# Patient Record
Sex: Female | Born: 1990
Health system: Southern US, Community
[De-identification: ages and names within clinical notes are randomized; demographics above are authoritative.]

## PROBLEM LIST (undated history)

## (undated) DIAGNOSIS — I10 Essential (primary) hypertension: Secondary | ICD-10-CM

## (undated) DIAGNOSIS — E46 Unspecified protein-calorie malnutrition: Secondary | ICD-10-CM

## (undated) DIAGNOSIS — R0989 Other specified symptoms and signs involving the circulatory and respiratory systems: Secondary | ICD-10-CM

## (undated) DIAGNOSIS — Z599 Problem related to housing and economic circumstances, unspecified: Secondary | ICD-10-CM

## (undated) DIAGNOSIS — B181 Chronic viral hepatitis B without delta-agent: Secondary | ICD-10-CM

## (undated) DIAGNOSIS — B20 Human immunodeficiency virus [HIV] disease: Secondary | ICD-10-CM

## (undated) DIAGNOSIS — Z21 Asymptomatic human immunodeficiency virus [HIV] infection status: Secondary | ICD-10-CM

## (undated) DIAGNOSIS — R1310 Dysphagia, unspecified: Secondary | ICD-10-CM

## (undated) DIAGNOSIS — L0291 Cutaneous abscess, unspecified: Secondary | ICD-10-CM

## (undated) HISTORY — DX: Problem related to housing and economic circumstances, unspecified: Z59.9

## (undated) HISTORY — DX: Asymptomatic human immunodeficiency virus (hiv) infection status: Z21

## (undated) HISTORY — DX: Dysphagia, unspecified: R13.10

## (undated) HISTORY — DX: Cutaneous abscess, unspecified: L02.91

## (undated) HISTORY — DX: Other specified symptoms and signs involving the circulatory and respiratory systems: R09.89

## (undated) HISTORY — DX: Essential (primary) hypertension: I10

## (undated) HISTORY — DX: Chronic viral hepatitis B without delta-agent: B18.1

## (undated) HISTORY — DX: Unspecified protein-calorie malnutrition: E46

## (undated) HISTORY — DX: Human immunodeficiency virus (HIV) disease: B20

---

## 2014-06-28 ENCOUNTER — Encounter (HOSPITAL_COMMUNITY): Payer: Self-pay | Admitting: Emergency Medicine

## 2014-06-28 ENCOUNTER — Emergency Department (INDEPENDENT_AMBULATORY_CARE_PROVIDER_SITE_OTHER)
Admission: EM | Admit: 2014-06-28 | Discharge: 2014-06-28 | Disposition: A | Discharge status: Home / Self Care | Payer: Medicaid Other | Source: Home / Self Care | Attending: Family Medicine | Admitting: Family Medicine

## 2014-06-28 DIAGNOSIS — R002 Palpitations: Secondary | ICD-10-CM | POA: Diagnosis not present

## 2014-06-28 NOTE — ED Provider Notes (Signed)
Marie Olson is a 24 y.o. female who presents to Urgent Care today for palpitations present for 2 days. Patient denies any syncope chest pain or shortness of breath. No vomiting or diarrhea. No medications. Patient is a refugee from [place] arriving 8 days ago.   History reviewed. No pertinent past medical history. History reviewed. No pertinent past surgical history. History  Substance Use Topics  . Smoking status: Never Smoker   . Smokeless tobacco: Not on file  . Alcohol Use: No   ROS as above Medications: No current facility-administered medications for this encounter.   No current outpatient prescriptions on file.   No Known Allergies   Exam:  BP 120/70 mmHg  Pulse 102  Temp(Src) 98.5 F (36.9 C) (Oral)  Resp 16  SpO2 100%  LMP 06/24/2014 Gen: Well NAD nontoxic appearing HEENT: EOMI,  MMM Lungs: Normal work of breathing. CTABL Heart: RRR no MRG rate 85 bpm per my check Abd: NABS, Soft. Nondistended, Nontender Exts: Brisk capillary refill, warm and well perfused. Non-edematous  Twelve-lead EKG shows sinus rhythm at 94 bpm. No ST segment elevation or depression. No Q waves. PR interval is slightly short at 110 ms. QTc 427  No results found for this or any previous visit (from the past 24 hour(s)). No results found.  Assessment and Plan: 24 y.o. female with palpitations. This is likely anxiety. EKG is largely normal with short PR interval at 110 ms. Will refer to Dr. Gwendolyn GrantWalden at immigration clinic for further follow-up. Present to the emergency room if worsening.  A swahili interpreter was used for today's visit.  Discussed warning signs or symptoms. Please see discharge instructions. Patient expresses understanding.     Rodolph BongEvan S Curtez Brallier, MD 06/28/14 [phone]

## 2014-06-28 NOTE — Discharge Instructions (Signed)
Thank you for coming in today. Call or go to the emergency room if you get worse, have trouble breathing, have chest pains, or palpitations.    

## 2014-06-28 NOTE — ED Notes (Signed)
Pt was being screened at a clinic and was told to come and be evaluated due to rapid heart rate.   Denies chest pain and sob.

## 2014-07-06 ENCOUNTER — Encounter: Payer: Self-pay | Admitting: Family Medicine

## 2014-07-06 ENCOUNTER — Ambulatory Visit (INDEPENDENT_AMBULATORY_CARE_PROVIDER_SITE_OTHER): Payer: Medicaid Other | Admitting: Family Medicine

## 2014-07-06 VITALS — BP 128/82 | HR 96 | Temp 98.6°F | Ht 59.5 in | Wt 96.0 lb

## 2014-07-06 DIAGNOSIS — Z603 Acculturation difficulty: Secondary | ICD-10-CM

## 2014-07-06 DIAGNOSIS — R002 Palpitations: Secondary | ICD-10-CM

## 2014-07-06 DIAGNOSIS — Z0289 Encounter for other administrative examinations: Secondary | ICD-10-CM

## 2014-07-06 DIAGNOSIS — Z008 Encounter for other general examination: Secondary | ICD-10-CM

## 2014-07-06 NOTE — Patient Instructions (Signed)
Your heart rate and blood pressure are good today.  Come back to make sure you're okay in 1 month.    It was good to see you.

## 2014-07-06 NOTE — Progress Notes (Signed)
[employer] interpretor used to conduct visit.  Name: Maralyn SagoSarah ID# [id number] Obinna Ehresman, Maryjo RochesterJessica Dawn

## 2014-07-06 NOTE — Progress Notes (Signed)
Patient ID: Marie Olson, female   DOB: 10/25/1990, 24 y.o.   MRN: [id number] Phone interpreter utilized during today's visit - Swahili.  Marie Olson (case Haematologistmanager [facility] Services) also present today for entire visit.   Imm[facility] New Patient Visit  HPI:  [facility] patient appointment to establish general primary care, also to discuss heart palpitations.    Heart palpitations.  Some confusion over what she means by this.  She calls this "pressure."  Was told at overseas exam prior to arriving in [place] she had HTN.  However presented for this at Urgent Care, and seems it was more palpitations.  Describes occaional "heart-racing."  Inconsistent when this occurs.  Denies SOB.  Denies chest pain or syncope/pre-syncope.  Adequate amounts of food/water intake.  Denies any symptoms since being seen [facility]  ROS: See HPI  Immigrant Social History: - Date arrived in [place]: Jun 13 2014 - Country of origin: [place].  Fled in 1996 and has lived in refugee camp in [place] since then. Fled due to ongoing war.  - Primary language: Swahili  -Requires intepreter (essentially speaks no [place]) - Tobacco/alcohol/drug use: denies - Marriage Status: married, lives at home with husband and 3 children. - Were you beaten or tortured in your country or refugee camp?  Denies  LMP was Feb 26 (last week)   Past Medical Hx:  -denies other than high blood pressure at overseas eam  Past Surgical Hx:  -denies   PHYSICAL EXAM: BP 128/82 mmHg  Pulse 96  Temp(Src) 98.6 F (37 C) (Oral)  Ht 4' 11.5" (1.511 m)  Wt 96 lb (43.545 kg)  BMI 19.07 kg/m2  LMP 06/24/2014 Gen: Well NAD HEENT: EOMI,  MMM Lungs: CTABL Nl WOB Heart: RRR no MRG Abd: NABS, NT, ND Exts: Non edematous BL  LE, warm and well perfused. Psych:  Not depressed, anxious appearing.  Smiling and conversant. Neuro:  No focal deficits noted.

## 2014-07-07 DIAGNOSIS — R002 Palpitations: Secondary | ICD-10-CM | POA: Insufficient documentation

## 2014-07-07 DIAGNOSIS — Z603 Acculturation difficulty: Secondary | ICD-10-CM | POA: Insufficient documentation

## 2014-07-07 DIAGNOSIS — Z3169 Encounter for other general counseling and advice on procreation: Secondary | ICD-10-CM | POA: Insufficient documentation

## 2014-07-07 NOTE — Assessment & Plan Note (Signed)
Unclear symptoms.  [facility] had [facility].  No worrying symptoms.  EKG there showed only short PR interval.   FU in 1 month to re-assess (daughter returning then) and after obtaining HD records, can space out more appropriately after that.

## 2014-08-01 ENCOUNTER — Telehealth: Payer: Self-pay

## 2014-08-01 DIAGNOSIS — Z21 Asymptomatic human immunodeficiency virus [HIV] infection status: Secondary | ICD-10-CM | POA: Insufficient documentation

## 2014-08-01 DIAGNOSIS — B20 Human immunodeficiency virus [HIV] disease: Secondary | ICD-10-CM | POA: Insufficient documentation

## 2014-08-01 NOTE — Telephone Encounter (Signed)
Call made to patient via [employer]. Patient speaks Swahili   Appointment given for patient and spouse. Referral received from [facility].  Patient advised to contact his Case Manager with [employer] [facility].   Tammy K King, RN     

## 2014-08-03 ENCOUNTER — Encounter: Payer: Self-pay | Admitting: Family Medicine

## 2014-08-03 ENCOUNTER — Ambulatory Visit (INDEPENDENT_AMBULATORY_CARE_PROVIDER_SITE_OTHER): Payer: Medicaid Other | Admitting: Family Medicine

## 2014-08-03 ENCOUNTER — Ambulatory Visit: Payer: Medicaid Other

## 2014-08-03 VITALS — BP 137/93 | HR 93 | Temp 97.9°F | Ht 60.0 in | Wt 94.7 lb

## 2014-08-03 DIAGNOSIS — R002 Palpitations: Secondary | ICD-10-CM

## 2014-08-03 NOTE — Assessment & Plan Note (Addendum)
History of daily symptoms. Possibly related to anxiety, however cannot rule out primary cardiac etiology. Does not seem like PAC/PVCs from history. Possibly PSVT with decreased PR from previous EKG  Echocardiogram  Cardiology referral for cardiac monitoring device  Will hold off on starting beta blocker

## 2014-08-03 NOTE — Progress Notes (Signed)
Patient was accompanied by Joyce from [employer] because of language barrier.Simpson, Michelle R  

## 2014-08-03 NOTE — Progress Notes (Signed)
    Subjective    Marie Olson is a 24 y.o. female that presents for an office visit.   1. Heart palpitations: Symptoms started about 2 months ago. They occur intermittently every day. She says that it feels like rapid heart beats and not flip-flopping. Episodes last about 3-4 minutes. Symptoms appear to be precipitated by excitement usually, including when there are noises (doorbell ringing, traffic noises, etc). She sits down/lies down and symptoms improve gradually. She reports no chest pain but has some shortness of breath with episodes. She has been eating less because these symptoms sometimes worsen after eating.  History  Substance Use Topics  . Smoking status: Never Smoker   . Smokeless tobacco: Not on file  . Alcohol Use: No    No Known Allergies  No orders of the defined types were placed in this encounter.    ROS  Per HPI   Objective   BP 137/93 mmHg  Pulse 93  Temp(Src) 97.9 F (36.6 C) (Oral)  Ht 5' (1.524 m)  Wt 94 lb 11.2 oz (42.956 kg)  BMI 18.49 kg/m2  LMP 07/06/2014 (Exact Date)  General: Well appearing, no distress Cardiovascular: Regular rate and rhythm, no murmur, rub or gallop, no carotid bruits  Assessment and Plan   Please refer to problem based charting of assessment and plan

## 2014-08-03 NOTE — Patient Instructions (Signed)
Thank you for coming to see me today. It was a pleasure. Today we talked about:   Palpitations: I will get an echocardiogram to see how your heart structure looks. I will also refer you to Cardiology to get a monitor.  Please make an appointment to see me in 4 weeks, or sooner for follow-up.  If you have any questions or concerns, please do not hesitate to call the office at [phone].  Sincerely,  Marie Hawkingalph Kya Mayfield, MD

## 2014-08-06 NOTE — Progress Notes (Signed)
I was available as preceptor to resident for this patient's office visit.  

## 2014-08-10 ENCOUNTER — Other Ambulatory Visit: Payer: Self-pay | Admitting: Family Medicine

## 2014-08-10 DIAGNOSIS — B20 Human immunodeficiency virus [HIV] disease: Secondary | ICD-10-CM

## 2014-08-17 ENCOUNTER — Other Ambulatory Visit: Payer: Self-pay | Admitting: Infectious Disease

## 2014-08-17 ENCOUNTER — Other Ambulatory Visit (HOSPITAL_COMMUNITY)
Admission: RE | Admit: 2014-08-17 | Discharge: 2014-08-17 | Disposition: A | Discharge status: Home / Self Care | Payer: Medicaid Other | Source: Ambulatory Visit | Attending: Infectious Disease | Admitting: Infectious Disease

## 2014-08-17 ENCOUNTER — Ambulatory Visit: Payer: Medicaid Other

## 2014-08-17 DIAGNOSIS — B20 Human immunodeficiency virus [HIV] disease: Secondary | ICD-10-CM

## 2014-08-17 DIAGNOSIS — Z113 Encounter for screening for infections with a predominantly sexual mode of transmission: Secondary | ICD-10-CM | POA: Diagnosis present

## 2014-08-17 DIAGNOSIS — B181 Chronic viral hepatitis B without delta-agent: Secondary | ICD-10-CM

## 2014-08-17 LAB — T-HELPER CELL (CD4) - (RCID CLINIC ONLY)
CD4 T CELL HELPER: 19 % — AB (ref 33–55)
CD4 T Cell Abs: 220 /uL — ABNORMAL LOW (ref 400–2700)

## 2014-08-18 LAB — COMPLETE METABOLIC PANEL WITH GFR
ALT: 12 U/L (ref 0–35)
AST: 24 U/L (ref 0–37)
Albumin: 4.1 g/dL (ref 3.5–5.2)
Alkaline Phosphatase: 45 U/L (ref 39–117)
BILIRUBIN TOTAL: 0.4 mg/dL (ref 0.2–1.2)
BUN: 10 mg/dL (ref 6–23)
CO2: 21 mEq/L (ref 19–32)
CREATININE: 0.63 mg/dL (ref 0.50–1.10)
Calcium: 9.2 mg/dL (ref 8.4–10.5)
Chloride: 104 mEq/L (ref 96–112)
GFR, Est African American: >89 mL/min
GLUCOSE: 103 mg/dL — AB (ref 70–99)
Potassium: 3.6 mEq/L (ref 3.5–5.3)
SODIUM: 134 meq/L — AB (ref 135–145)
TOTAL PROTEIN: 8.8 g/dL — AB (ref 6.0–8.3)

## 2014-08-18 LAB — HEPATITIS C ANTIBODY: HCV Ab: NEGATIVE

## 2014-08-18 LAB — CBC WITH DIFFERENTIAL/PLATELET
BASOS ABS: 0 10*3/uL (ref 0.0–0.1)
Basophils Relative: 1 % (ref 0–1)
EOS PCT: 9 % — AB (ref 0–5)
Eosinophils Absolute: 0.4 10*3/uL (ref 0.0–0.7)
HCT: 36.4 % (ref 36.0–46.0)
Hemoglobin: 12.5 g/dL (ref 12.0–15.0)
LYMPHS PCT: 26 % (ref 12–46)
Lymphs Abs: 1.2 10*3/uL (ref 0.7–4.0)
MCH: 29.6 pg (ref 26.0–34.0)
MCHC: 34.3 g/dL (ref 30.0–36.0)
MCV: 86.3 fL (ref 78.0–100.0)
MPV: 10.1 fL (ref 8.6–12.4)
Monocytes Absolute: 0.4 10*3/uL (ref 0.1–1.0)
Monocytes Relative: 8 % (ref 3–12)
NEUTROS ABS: 2.7 10*3/uL (ref 1.7–7.7)
Neutrophils Relative %: 56 % (ref 43–77)
PLATELETS: 207 10*3/uL (ref 150–400)
RBC: 4.22 MIL/uL (ref 3.87–5.11)
RDW: 14.1 % (ref 11.5–15.5)
WBC: 4.8 10*3/uL (ref 4.0–10.5)

## 2014-08-18 LAB — URINALYSIS
BILIRUBIN URINE: NEGATIVE
Glucose, UA: NEGATIVE mg/dL
HGB URINE DIPSTICK: NEGATIVE
Ketones, ur: NEGATIVE mg/dL
Leukocytes, UA: NEGATIVE
Nitrite: NEGATIVE
PH: 5.5 (ref 5.0–8.0)
Protein, ur: NEGATIVE mg/dL
SPECIFIC GRAVITY, URINE: 1.014 (ref 1.005–1.030)
UROBILINOGEN UA: 0.2 mg/dL (ref 0.0–1.0)

## 2014-08-18 LAB — LIPID PANEL
CHOLESTEROL: 155 mg/dL (ref 0–200)
HDL: 45 mg/dL — ABNORMAL LOW (ref >=46)
LDL CALC: 101 mg/dL — AB (ref 0–99)
TRIGLYCERIDES: 44 mg/dL (ref <150)
Total CHOL/HDL Ratio: 3.4 Ratio
VLDL: 9 mg/dL (ref 0–40)

## 2014-08-18 LAB — HEPATITIS B SURFACE ANTIGEN: HEP B S AG: POSITIVE — AB

## 2014-08-18 LAB — HEPATITIS B SURFACE ANTIBODY,QUALITATIVE: Hep B S Ab: NEGATIVE

## 2014-08-18 LAB — HEPATITIS B CORE ANTIBODY, TOTAL: HEP B C TOTAL AB: REACTIVE — AB

## 2014-08-18 LAB — RPR

## 2014-08-18 LAB — HEPATITIS A ANTIBODY, TOTAL: HEP A TOTAL AB: REACTIVE — AB

## 2014-08-18 LAB — HEPATITIS B SURF AG CONFIRMATION: Hepatitis B Surf Ag Confirmation: POSITIVE — AB

## 2014-08-20 DIAGNOSIS — B181 Chronic viral hepatitis B without delta-agent: Secondary | ICD-10-CM | POA: Insufficient documentation

## 2014-08-20 LAB — URINE CYTOLOGY ANCILLARY ONLY
Chlamydia: NEGATIVE
Neisseria Gonorrhea: NEGATIVE

## 2014-08-21 LAB — HIV-1 RNA ULTRAQUANT REFLEX TO GENTYP+
HIV 1 RNA Quant: 74516 copies/mL — ABNORMAL HIGH (ref <20)
HIV-1 RNA Quant, Log: 4.87 {Log} — ABNORMAL HIGH (ref <1.30)

## 2014-08-22 NOTE — Progress Notes (Signed)
Interpreter used:  Zara ChessJean Bosco with [employer]. Patient speaks Swahili.  [facility] after testing positive for HIV during a refugee physical.  She has three children all delivered through vaginal birth.  Her  26 year old daughter  has tested positive for HIV and is in care with [facility], [facility].  [place] and her spouse are both HIV positive and neither were aware of their status prior to February, 2016. She  denies any night sweats, fatigue, rashes or unintentional weight loss.   Her only complaint is pain from a  broken tooth  and pain in back of throat and upper neck area.   Hepatitis B Core Antibody positive. 08-17-14.

## 2014-08-23 LAB — QUANTIFERON TB GOLD ASSAY (BLOOD)
Mitogen value: 0.51 IU/mL
Quantiferon Nil Value: 0.04 IU/mL
Quantiferon Tb Ag Minus Nil Value: 0 IU/mL
TB Ag value: 0.04 IU/mL

## 2014-08-24 LAB — HLA B*5701: HLA-B 5701 W/RFLX HLA-B HIGH: NEGATIVE

## 2014-08-26 ENCOUNTER — Encounter (HOSPITAL_COMMUNITY): Payer: Self-pay | Admitting: Emergency Medicine

## 2014-08-26 ENCOUNTER — Observation Stay (HOSPITAL_COMMUNITY)
Admission: EM | Admit: 2014-08-26 | Discharge: 2014-08-27 | Disposition: A | Discharge status: Home / Self Care | Payer: Medicaid Other | Attending: Family Medicine | Admitting: Family Medicine

## 2014-08-26 DIAGNOSIS — L02412 Cutaneous abscess of left axilla: Secondary | ICD-10-CM | POA: Diagnosis not present

## 2014-08-26 DIAGNOSIS — L0291 Cutaneous abscess, unspecified: Secondary | ICD-10-CM | POA: Diagnosis not present

## 2014-08-26 DIAGNOSIS — Z21 Asymptomatic human immunodeficiency virus [HIV] infection status: Secondary | ICD-10-CM

## 2014-08-26 DIAGNOSIS — L0390 Cellulitis, unspecified: Secondary | ICD-10-CM

## 2014-08-26 DIAGNOSIS — B20 Human immunodeficiency virus [HIV] disease: Secondary | ICD-10-CM | POA: Insufficient documentation

## 2014-08-26 DIAGNOSIS — R000 Tachycardia, unspecified: Secondary | ICD-10-CM | POA: Diagnosis present

## 2014-08-26 DIAGNOSIS — L02411 Cutaneous abscess of right axilla: Secondary | ICD-10-CM | POA: Diagnosis not present

## 2014-08-26 DIAGNOSIS — B181 Chronic viral hepatitis B without delta-agent: Secondary | ICD-10-CM | POA: Diagnosis not present

## 2014-08-26 DIAGNOSIS — E876 Hypokalemia: Secondary | ICD-10-CM | POA: Insufficient documentation

## 2014-08-26 LAB — CBC WITH DIFFERENTIAL/PLATELET
BASOS PCT: 0 % (ref 0–1)
Basophils Absolute: 0 10*3/uL (ref 0.0–0.1)
Eosinophils Absolute: 0.6 10*3/uL (ref 0.0–0.7)
Eosinophils Relative: 7 % — ABNORMAL HIGH (ref 0–5)
HCT: 39.8 % (ref 36.0–46.0)
Hemoglobin: 14 g/dL (ref 12.0–15.0)
LYMPHS PCT: 22 % (ref 12–46)
Lymphs Abs: 1.9 10*3/uL (ref 0.7–4.0)
MCH: 30.3 pg (ref 26.0–34.0)
MCHC: 35.2 g/dL (ref 30.0–36.0)
MCV: 86.1 fL (ref 78.0–100.0)
Monocytes Absolute: 0.7 10*3/uL (ref 0.1–1.0)
Monocytes Relative: 8 % (ref 3–12)
Neutro Abs: 5.3 10*3/uL (ref 1.7–7.7)
Neutrophils Relative %: 63 % (ref 43–77)
Platelets: 241 10*3/uL (ref 150–400)
RBC: 4.62 MIL/uL (ref 3.87–5.11)
RDW: 13 % (ref 11.5–15.5)
WBC: 8.4 10*3/uL (ref 4.0–10.5)

## 2014-08-26 LAB — BASIC METABOLIC PANEL
ANION GAP: 8 (ref 5–15)
BUN: 11 mg/dL (ref 6–23)
CALCIUM: 9.1 mg/dL (ref 8.4–10.5)
CO2: 23 mmol/L (ref 19–32)
Chloride: 102 mmol/L (ref 96–112)
Creatinine, Ser: 0.82 mg/dL (ref 0.50–1.10)
GFR calc Af Amer: >90 mL/min (ref >90)
GLUCOSE: 106 mg/dL — AB (ref 70–99)
Potassium: 3.2 mmol/L — ABNORMAL LOW (ref 3.5–5.1)
Sodium: 133 mmol/L — ABNORMAL LOW (ref 135–145)

## 2014-08-26 LAB — TSH: TSH: 1.386 u[IU]/mL (ref 0.350–4.500)

## 2014-08-26 LAB — CBG MONITORING, ED: Glucose-Capillary: 75 mg/dL (ref 70–99)

## 2014-08-26 LAB — LACTIC ACID, PLASMA: Lactic Acid, Venous: 1.4 mmol/L (ref 0.5–2.0)

## 2014-08-26 LAB — I-STAT CG4 LACTIC ACID, ED: Lactic Acid, Venous: 2.82 mmol/L (ref 0.5–2.0)

## 2014-08-26 MED ORDER — ONDANSETRON HCL 4 MG/2ML IJ SOLN
4.0000 mg | Freq: Once | INTRAMUSCULAR | Status: AC
  Administered 2014-08-26: 4 mg via INTRAVENOUS
  Filled 2014-08-26: qty 2

## 2014-08-26 MED ORDER — SODIUM CHLORIDE 0.9 % IJ SOLN: 3.0000 mL | INTRAMUSCULAR | Status: DC | PRN

## 2014-08-26 MED ORDER — SODIUM CHLORIDE 0.9 % IJ SOLN: 3.0000 mL | Freq: Two times a day (BID) | INTRAMUSCULAR | Status: DC

## 2014-08-26 MED ORDER — SODIUM CHLORIDE 0.9 % IV SOLN: 250.0000 mL | INTRAVENOUS | Status: DC | PRN

## 2014-08-26 MED ORDER — CLINDAMYCIN HCL 300 MG PO CAPS
300.0000 mg | ORAL_CAPSULE | Freq: Four times a day (QID) | ORAL | Status: DC
  Administered 2014-08-26 – 2014-08-27 (×3): 300 mg via ORAL
  Filled 2014-08-26 (×6): qty 1

## 2014-08-26 MED ORDER — SODIUM CHLORIDE 0.9 % IV BOLUS (SEPSIS)
1000.0000 mL | Freq: Once | INTRAVENOUS | Status: AC
  Administered 2014-08-26: 1000 mL via INTRAVENOUS

## 2014-08-26 MED ORDER — LIDOCAINE HCL 2 % IJ SOLN
10.0000 mL | Freq: Once | INTRAMUSCULAR | Status: AC
  Administered 2014-08-26: 200 mg
  Filled 2014-08-26: qty 20

## 2014-08-26 MED ORDER — ACETAMINOPHEN 650 MG RE SUPP
650.0000 mg | Freq: Four times a day (QID) | RECTAL | Status: DC | PRN
Start: 2014-08-26 — End: 2014-08-27

## 2014-08-26 MED ORDER — SULFAMETHOXAZOLE-TRIMETHOPRIM 800-160 MG PO TABS: 1.0000 | ORAL_TABLET | Freq: Once | ORAL | Status: DC

## 2014-08-26 MED ORDER — CLINDAMYCIN HCL 300 MG PO CAPS
300.0000 mg | ORAL_CAPSULE | Freq: Once | ORAL | Status: AC
Start: 2014-08-26 — End: 2014-08-26
  Administered 2014-08-26: 300 mg via ORAL
  Filled 2014-08-26: qty 1

## 2014-08-26 MED ORDER — POTASSIUM CHLORIDE CRYS ER 20 MEQ PO TBCR
30.0000 meq | EXTENDED_RELEASE_TABLET | Freq: Two times a day (BID) | ORAL | Status: AC
  Administered 2014-08-26 – 2014-08-27 (×2): 30 meq via ORAL
  Filled 2014-08-26 (×2): qty 1

## 2014-08-26 MED ORDER — HEPARIN SODIUM (PORCINE) 5000 UNIT/ML IJ SOLN
5000.0000 [IU] | Freq: Three times a day (TID) | INTRAMUSCULAR | Status: DC
  Administered 2014-08-26 – 2014-08-27 (×3): 5000 [IU] via SUBCUTANEOUS
  Filled 2014-08-26 (×3): qty 1

## 2014-08-26 MED ORDER — POTASSIUM CHLORIDE CRYS ER 20 MEQ PO TBCR: 40.0000 meq | EXTENDED_RELEASE_TABLET | Freq: Two times a day (BID) | ORAL | Status: DC

## 2014-08-26 MED ORDER — SODIUM CHLORIDE 0.9 % IJ SOLN
3.0000 mL | Freq: Two times a day (BID) | INTRAMUSCULAR | Status: DC
  Administered 2014-08-26: 3 mL via INTRAVENOUS

## 2014-08-26 MED ORDER — SODIUM CHLORIDE 0.9 % IV SOLN
INTRAVENOUS | Status: AC
  Administered 2014-08-26: 19:00:00 via INTRAVENOUS

## 2014-08-26 MED ORDER — ACETAMINOPHEN 325 MG PO TABS: 650.0000 mg | ORAL_TABLET | Freq: Four times a day (QID) | ORAL | Status: DC | PRN

## 2014-08-26 MED ORDER — KETOROLAC TROMETHAMINE 30 MG/ML IJ SOLN
30.0000 mg | Freq: Once | INTRAMUSCULAR | Status: AC
  Administered 2014-08-26: 30 mg via INTRAVENOUS
  Filled 2014-08-26: qty 1

## 2014-08-26 NOTE — ED Notes (Signed)
Provider made aware of abnormal lactic. 

## 2014-08-26 NOTE — ED Notes (Signed)
Through interpreter: Pt noticed tenderness 3 days ago. Swelling and pain  Increased. Currently 2 cm red, swollen area noted under l/arm. Pt advised that the doctor will use numbing medicine to treat the area before they cut into it. Pt stated that she has no hx of abscesses. All verbal information was relayed via speaker phone with Swahili interpreter

## 2014-08-26 NOTE — ED Notes (Signed)
PA and RN at bedside for procedure.

## 2014-08-26 NOTE — ED Notes (Signed)
Pt transported by [facility] to [facility] at this time.

## 2014-08-26 NOTE — H&P (Signed)
Family Medicine Teaching [facility] Admission History and Physical Service Pager: [phone]  Patient name: Marie Olson Medical record number: [id number] Date of birth: [birth year] Age: 24 y.o. Gender: female  Primary Care Provider: Jacquelin Hawking, MD Consultants: None Code Status: Full  Chief Complaint: Sinus Tachycardia  Assessment and Plan: Sheleen Conchas is a 24 y.o. female presenting with sinus tachycardia s/p I&D for bilateral abscesses. Noted to have heart rate of 90s at last office visit and scheduled for Cardiology followup on 09/05/14. PMH is significant for HIV, hepatitis A, and hepatitis B.   # Sinus Tachycardia- HR 140 at presentation. TSH normal. EKG with sinus tachycardia. Given 3L bolus in ED with improvement. HR 103 after transfer to [facility] in Observation, McDiarmid attending - Telemetry - Monitor HR - Follow up EKG - 2D Echo in AM - Outpatient follow up appointment scheduled with Cardiologist on 09/05/14  # Hx of palpitations: has follow-up with cardiology on 09/05/2014. Currently without symptoms - Telemetry as above  # S/P I&D for Abscesses with Cellulitis- Located in bilateral axilla. Lactic Acid 2.82. WBC 8.4. Oral Clindamycin  given at [facility]. Lactic Acid at arrival to [facility] 1.4. - Follow up CBC in am - Follow up blood cultures - Continue oral Clindamycin - Not packed at I&D. Consider packing to promote healing  # Hypokalemia: Potassium 3.2 - Replete today - Recheck bmet in AM  # HIV/Hepatitis A/Hepatitis B- CD4 220. HIV RNA Quant [id number]. HIV Genotype pending. Hepatitis A total Ab reactive. Hepatitis B surface Ag and core Ab reactive. HCV Ab negative. Not currently on medications. Spoke with Dr. Luciana Axe, infectious disease. OK for patient to follow-up with ID clinic as scheduled. No acute management needed while inpatient. - Outpatient appointment with ID scheduled on 09/10/14  FEN/GI: Regular Diet Prophylaxis:  Heparin  Disposition: Place in Observation, McDiarmid attending. Anticipate discharge 4/25 with outpatient follow up with Cardiology and Infectious Disease  History of Present Illness: Marie Olson is a 25 y.o. female presenting with 1 day history of abscesses under both of her arms. States she has noted some blood drainage. Denies pain following I&D. Has never had abscesses before.  Initially presented to [facility] ED and diagnosed with cellulitis and abscesses of bilateral axilla (present for 4 days). I&D performed bilaterally. Initiated on oral Clindamycin. While in the ED, she was found to be tachycardic to 144 noted. Contacted Family Medicine Teaching Service for transfer and EKG requested. EKG obtained showing Sinus Tachycardia. Patient reports no lightheadedness. She reports having some atypical, non-radiating chest pain vs palpitations (skipping beats) and shortness of breath earlier today which resolved with IV fluids. Upon further questioning, she states that it is not chest pain but instead her heart beating in her chest. She does not report fevers, chills or leg pain. Denies any other type of rash or abscess.  Visit conducted with help of Swahili interpreter via [employer]  Review Of Systems: Per HPI  Otherwise 12 point review of systems was performed and was unremarkable.  Patient Active Problem List   Diagnosis Date Noted  . Abscess and cellulitis 08/26/2014  . 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 HIV infection 08/01/2014  . Refugee health examination 07/07/2014  . Immigrant with language difficulty 07/07/2014  . Palpitations 07/07/2014   Past Medical History: History reviewed. No pertinent past medical history. Past Surgical History: History reviewed. No pertinent past surgical history. Social History: History  Substance Use Topics  . Smoking status: Never Smoker   .  Smokeless tobacco: Not on file  . Alcohol Use: No   Please also refer to relevant sections  of EMR.  Family History: Family History  Problem Relation Age of Onset  . Family history unknown: Yes   Allergies and Medications: No Known Allergies No current facility-administered medications on file prior to encounter.   No current outpatient prescriptions on file prior to encounter.    Objective: BP 136/85 mmHg  Pulse 129  Temp(Src) 98.9 F (37.2 C) (Rectal)  Resp 16  SpO2 100%  LMP 08/25/2014 (Exact Date) Exam: General: 24yo female resting comfortably in no apparent distress HEENT: Moist mucous membranes, PERRLA Cardiovascular: S1 and S2 noted, tachycardic, II/VI systolic ejection murmur heard with no radiation Respiratory: Clear to auscultation bilaterally, no wheezing noted, no increased work of breathing Abdomen: Soft and non-distended, bowel sounds noted, no tenderness or masses to palpation Extremities: No edema noted, pulses palpated Skin: Indurated open and tender abscesses palpated in axilla bilaterally L>R, no packing noted, no other rashes noted Neuro: No focal deficits noted, Alert  Labs and Imaging: CBC BMET   Recent Labs Lab 08/26/14 1148  WBC 8.4  HGB 14.0  HCT 39.8  PLT 241    Recent Labs Lab 08/26/14 1148  NA 133*  K 3.2*  CL 102  CO2 23  BUN 11  CREATININE 0.82  GLUCOSE 106*  CALCIUM 9.1     - TSH 1.386 - Lactic Acid 2.82>>1.4  Araceli BoucheRaleigh N Rumley, DO 08/26/2014, 4:45 PM PGY-1, [facility] FPTS Intern pager: [phone], text pages welcome  I have seen and examined the patient. I have read and agree with the above note. My changes are noted in blue.  Jacquelin Hawkingalph Seven M[place], MD PGY-2, [facility] 08/26/2014, 10:34 PM

## 2014-08-26 NOTE — ED Provider Notes (Signed)
CSN: [id number]     Arrival date & time 08/26/14  1022 History   First MD Initiated Contact with Patient 08/26/14 1036     Chief Complaint  Patient presents with  . Abscess    abscess under l/axilla     (Consider location/radiation/quality/duration/timing/severity/associated sxs/prior Treatment) HPI    PCP: Jacquelin Hawking, MD Blood pressure 146/86, pulse 144, temperature 97.7 F (36.5 C), temperature source Oral, resp. rate 20, last menstrual period 08/25/2014, SpO2 100 %.  Marie Olson is a 24 y.o.female with a significant PMH of no known medical history presents to the ER with complaints of abscess to bilateral axilla. She reports developing the abscesses two days ago and today they became very painful. Therefore her significant other brought her here to the hospital. She denies having any weight loss, weight gain, hair loss, headaches, weakness, confusion, nausea, vomiting, diarrhea, abdominal pain, CP, back pain, dysuria, vaginal bleeding or any other associated complaints. She denies taking medications on a daily basis. Intriage she is noted to be significantly tachycardic at 145 BMP, afebrile, normal blood pressure.  PATIENT SPEAKS NO ENGLISH- ONLY Swahil, interview done with phone interpretor.   History reviewed. No pertinent past medical history. History reviewed. No pertinent past surgical history. Family History  Problem Relation Age of Onset  . Family history unknown: Yes   History  Substance Use Topics  . Smoking status: Never Smoker   . Smokeless tobacco: Not on file  . Alcohol Use: No   OB History    No data available     Review of Systems  10 Systems reviewed and are negative for acute change except as noted in the HPI.     Allergies  Review of patient's allergies indicates no known allergies.  Home Medications   Prior to Admission medications   Not on File   BP 136/85 mmHg  Pulse 129  Temp(Src) 98.9 F (37.2 C) (Rectal)  Resp 16  SpO2 100%   LMP 08/25/2014 (Exact Date) Physical Exam  Constitutional: She appears well-developed and well-nourished. No distress.  HENT:  Head: Normocephalic and atraumatic.  Eyes: Pupils are equal, round, and reactive to light.  Neck: Normal range of motion. Neck supple.  Cardiovascular: Regular rhythm.  Tachycardia present.   Pulmonary/Chest: Effort normal.  Abdominal: Soft.  Neurological: She is alert.  Skin: Skin is warm and dry.  One abscess to her right axilla, approx 1 x 3 cm in diameter. No associated cellulitis  3 large abscesses to left armpit, they coalesce into one.   Nursing note and vitals reviewed.   ED Course  Procedures (including critical care time) Labs Review Labs Reviewed  CBC WITH DIFFERENTIAL/PLATELET - Abnormal; Notable for the following:    Eosinophils Relative 7 (*)    All other components within normal limits  BASIC METABOLIC PANEL - Abnormal; Notable for the following:    Sodium 133 (*)    Potassium 3.2 (*)    Glucose, Bld 106 (*)    All other components within normal limits  I-STAT CG4 LACTIC ACID, ED - Abnormal; Notable for the following:    Lactic Acid, Venous 2.82 (*)    All other components within normal limits  CULTURE, BLOOD (ROUTINE X 2)  CULTURE, BLOOD (ROUTINE X 2)  CULTURE, ROUTINE-ABSCESS  TSH  CBG MONITORING, ED  I-STAT CG4 LACTIC ACID, ED    Imaging Review No results found.   EKG Interpretation   Date/Time:  Sunday August 26 2014 15:38:17 EDT Ventricular Rate:  115 PR  Interval:  153 QRS Duration: 84 QT Interval:  342 QTC Calculation: 473 R Axis:   80 Text Interpretation:  Sinus tachycardia Borderline T wave abnormalities  Confirmed by ZAVITZ  MD, JOSHUA ([id number]) on 08/26/2014 4:08:45 PM      MDM   Final diagnoses:  Tachycardia  Abscess and cellulitis    INCISION AND DRAINAGE Performed by: Dorthula MatasGREENE,Randeep Biondolillo G Consent: Verbal consent obtained. Risks and benefits: risks, benefits and alternatives were discussed Type:  abscess  Body area: bilateral axilla  Anesthesia: local infiltration  Incision was made with a scalpel.  Local anesthetic: lidocaine 2% wo epinephrine  Anesthetic total: 3 ml  Complexity: complex Blunt dissection to break up loculations  Drainage: purulent  Drainage amount: moderate amount of purulent discharge  Packing material: None. I tried to explain packing to patient but she refused because she could not tolerate it. Patient tolerance: Patient tolerated the procedure well with no immediate complications.   Medications  0.9 %  sodium chloride infusion (not administered)  sodium chloride 0.9 % bolus 1,000 mL (0 mLs Intravenous Stopped 08/26/14 1253)  lidocaine (XYLOCAINE) 2 % (with pres) injection 200 mg (200 mg Other Given 08/26/14 1134)  ondansetron (ZOFRAN) injection 4 mg (4 mg Intravenous Given 08/26/14 1227)  ketorolac (TORADOL) 30 MG/ML injection 30 mg (30 mg Intravenous Given 08/26/14 1227)  sodium chloride 0.9 % bolus 1,000 mL (0 mLs Intravenous Stopped 08/26/14 1416)  sodium chloride 0.9 % bolus 1,000 mL (0 mLs Intravenous Stopped 08/26/14 1541)  clindamycin (CLEOCIN) capsule 300 mg (300 mg Oral Given 08/26/14 1541)   Patient has an elevated lactic acid. Normal TSH, labs otherwise unremarkable. She remains tachycardic at 130 despite sufficient fluid resuscitation (3 Liters and pain control)- Dr. Jodi MourningZavitz and myself agree that she needs admission to ensure that her that her heart rate returns to normal.   4:13 pm She is a family practice patient, they are at [facility]. She will need transfer over to that facility. They will evaluate her on arrival for further evaluation. - no temp orders or bed requests place. Family practice will decide course of action after they evaluate the patient.    Filed Vitals:   08/26/14 1426  BP:   Pulse:   Temp: 98.9 F (37.2 C)  Resp:       Marlon Peliffany Darya Bigler, PA-C 08/26/14 1613  Marlon Peliffany Kolden Dupee, PA-C 08/26/14 1615  Blane OharaJoshua Zavitz,  MD 08/26/14 [phone]

## 2014-08-26 NOTE — ED Notes (Signed)
Nurse drawing labs. 

## 2014-08-27 DIAGNOSIS — B181 Chronic viral hepatitis B without delta-agent: Secondary | ICD-10-CM | POA: Diagnosis not present

## 2014-08-27 DIAGNOSIS — R000 Tachycardia, unspecified: Secondary | ICD-10-CM | POA: Diagnosis not present

## 2014-08-27 DIAGNOSIS — B20 Human immunodeficiency virus [HIV] disease: Secondary | ICD-10-CM | POA: Insufficient documentation

## 2014-08-27 DIAGNOSIS — L0390 Cellulitis, unspecified: Secondary | ICD-10-CM | POA: Diagnosis not present

## 2014-08-27 DIAGNOSIS — R9431 Abnormal electrocardiogram [ECG] [EKG]: Secondary | ICD-10-CM | POA: Diagnosis not present

## 2014-08-27 LAB — BASIC METABOLIC PANEL
Anion gap: 7 (ref 5–15)
CALCIUM: 9 mg/dL (ref 8.4–10.5)
CHLORIDE: 109 mmol/L (ref 96–112)
CO2: 21 mmol/L (ref 19–32)
CREATININE: 0.65 mg/dL (ref 0.50–1.10)
Glucose, Bld: 83 mg/dL (ref 70–99)
POTASSIUM: 3.6 mmol/L (ref 3.5–5.1)
Sodium: 137 mmol/L (ref 135–145)

## 2014-08-27 LAB — PREGNANCY, URINE: Preg Test, Ur: NEGATIVE

## 2014-08-27 LAB — CBC
HCT: 34.4 % — ABNORMAL LOW (ref 36.0–46.0)
HEMOGLOBIN: 11.6 g/dL — AB (ref 12.0–15.0)
MCH: 29.1 pg (ref 26.0–34.0)
MCHC: 33.7 g/dL (ref 30.0–36.0)
MCV: 86.2 fL (ref 78.0–100.0)
PLATELETS: 212 10*3/uL (ref 150–400)
RBC: 3.99 MIL/uL (ref 3.87–5.11)
RDW: 13 % (ref 11.5–15.5)
WBC: 4.6 10*3/uL (ref 4.0–10.5)

## 2014-08-27 LAB — BRAIN NATRIURETIC PEPTIDE: B NATRIURETIC PEPTIDE 5: 89.5 pg/mL (ref 0.0–100.0)

## 2014-08-27 MED ORDER — METOPROLOL SUCCINATE ER 25 MG PO TB24
25.0000 mg | ORAL_TABLET | Freq: Every day | ORAL | Status: DC
  Administered 2014-08-27: 25 mg via ORAL
  Filled 2014-08-27: qty 1

## 2014-08-27 MED ORDER — SULFAMETHOXAZOLE-TRIMETHOPRIM 800-160 MG PO TABS: 1.0000 | ORAL_TABLET | Freq: Two times a day (BID) | ORAL | Status: DC

## 2014-08-27 MED ORDER — METOPROLOL SUCCINATE ER 25 MG PO TB24: 25.0000 mg | ORAL_TABLET | Freq: Every day | ORAL | Status: DC

## 2014-08-27 NOTE — Progress Notes (Signed)
Marie Olson to be D/C'd Home per MD order.  Discussed with the patient and all questions fully answered using [employer].   VSS. Clean, new gauze dressing under bilateral axillary.  IV catheter discontinued intact. Site without signs and symptoms of complications. Dressing and pressure applied.  An After Visit Summary was printed and given to the patient. Patient received prescription.  D/c education completed with patient/family including follow up instructions, medication list, d/c activities limitations if indicated, with other d/c instructions as indicated by MD - patient able to verbalize understanding, all questions fully answered.   Patient instructed to return to ED, call 911, or call MD for any changes in condition.   Patient escorted via WC, and D/C home via private auto.  L'ESPERANCE, Montine Hight C 08/27/2014 2:50 PM

## 2014-08-27 NOTE — Progress Notes (Signed)
  Echocardiogram 2D Echocardiogram has been performed.  Kyrsten Deleeuw 08/27/2014, 10:19 AM

## 2014-08-27 NOTE — Discharge Instructions (Signed)
You were admitted to the hospital with an elevated heart rate. This improved here with IV fluids. We did an echocardiogram to look at your heart which was normal.We started you on a medication to keep your heart rate from being too high. We also will send you home with an antibiotics for the abscesses under your arms. It is important that you [facility]. It is also important that you follow up with the cardiologist and Infectious disease doctors.   Abscess An abscess (boil or furuncle) is an infected area on or under the skin. This area is filled with yellowish-white fluid (pus) and other material (debris). HOME CARE   Only take medicines as told by your doctor.  If you were given antibiotic medicine, take it as directed. Finish the medicine even if you start to feel better.  If gauze is used, follow your doctor's directions for changing the gauze.  To avoid spreading the infection:  Keep your abscess covered with a bandage.  Wash your hands well.  Do not share personal care items, towels, or whirlpools with others.  Avoid skin contact with others.  Keep your skin and clothes clean around the abscess.  Keep all doctor visits as told. GET HELP RIGHT AWAY IF:   You have more pain, puffiness (swelling), or redness in the wound site.  You have more fluid or blood coming from the wound site.  You have muscle aches, chills, or you feel sick.  You have a fever. MAKE SURE YOU:   Understand these instructions.  Will watch your condition.  Will get help right away if you are not doing well or get worse. Document Released: 10/07/2007 Document Revised: 10/20/2011 Document Reviewed: 07/03/2011 [facility] Patient Information 2015 [employer] [place]. This information is not intended to replace advice given to you by your health care provider. Make sure you discuss any questions you have with your health care provider.

## 2014-08-27 NOTE — Care Management Note (Signed)
    Page 1 of 1   08/27/2014     3:44:23 PM CARE MANAGEMENT NOTE 08/27/2014  Patient:  Royden PurlKASHINDI,[name]   Account Number:  [id number]  Date Initiated:  08/27/2014  Documentation initiated by:  Lawerance SabalSWIST,DEBBIE  Subjective/Objective Assessment:   cellulitis, speaks swahili     Action/Plan:   will follow for dc needs   Anticipated DC Date:  08/28/2014   Anticipated DC Plan:  HOME/SELF CARE      DC Planning Services  CM consult      Choice offered to / List presented to:             Status of service:  In process, will continue to follow Medicare Important Message given?   (If response is "NO", the following Medicare IM given date fields will be blank) Date Medicare IM given:   Medicare IM given by:   Date Additional Medicare IM given:   Additional Medicare IM given by:    Discharge Disposition:  HOME/SELF CARE  Per UR Regulation:  Reviewed for med. necessity/level of care/duration of stay  If discussed at Long Length of Stay Meetings, dates discussed:    Comments:  08-27-14 discharged to home, no needs. Lawerance Sabalebbie Swist RN BSN CM  08-27-14 Will follow for dc needs.Lawerance Sabalebbie Swist RN BSN CM

## 2014-08-27 NOTE — Discharge Summary (Signed)
Family Medicine Teaching [facility] Discharge Summary  Patient name: Marie Olson Medical record number: [id number] Date of birth: [birth year] Age: 24 y.o. Gender: female Date of Admission: 08/26/2014  Date of Discharge: 08/26/2014 Admitting Physician: Leighton Roach McDiarmid, MD  Primary Care Provider: Jacquelin Hawking, MD Consultants: None  Indication for Hospitalization: Tachycardia  Discharge Diagnoses/Problem List:  Axillary abscesses, sinus tachycardia, history of palpitations, HIV, Hepatitis A, Hepatitis B  Disposition: Home  Discharge Condition: Improved  Discharge Exam:  Blood pressure 130/83, pulse 90, temperature 98 F (36.7 C), temperature source Oral, resp. rate 14, weight 98 lb 11.2 oz (44.77 kg), last menstrual period 08/25/2014, SpO2 100 %. GENL 24 year old female resting comfortably in hospital bed in NAD HEENT: NCAT, MMM CV: RRR, 2/6 systolic ejection murmur heard with no radiation Resp: NWOB, CTAB Abdomen: +BS, soft, NT, ND Skin: Indurated abscesses in bilateral axilla L>R s/p I&D, no fluctuance appreciated, no packing Neuro: Alert and oriented. No focal neurological deficits  Brief Hospital Course:  Marie Olson is a 24 year old female who presented to [facility] ED with bilateral axillary abscesses. I&D was performed bilaterally and patient was started on oral clindamycin. She was noted to be tachycardic in the ED and was subsequently transferred to [facility] for further management and observation given persistent tachycardia and increased lactic acid level in a patient with HIV. Her hospital course by problem is outlined below:  Sinus Tachycardia. Patient's HR improved to low 100s after 3L of NS. She had an EKG performed which showed Sinus tachycardia. We obtained an echocardiogram which was normal. Patient was monitored on telemetry with no events. TSH was normal. Patient reported history of palpitations and we started the patient on metoprolol  daily. She  will have follow up with cardiology for further work up as an outpatient.   Bilateral Axillary Abscesses. Patient with no fevers and normal WBC. Lactic acid improved to 1.4 with IVF. Patient was initially given oral clindamycin in the ED and was switched to oral Bactrim at discharge for better bacteriocidal coverage. Urine pregnancy test was negative. She will be discharged home to complete a total 14 day course of oral antibiotics given her immunocompromised status.   ID: Patient with CD4 220, HIV RNA Quant [id number], and HIV genotype pending. Also with reactive Hepatitis A Ab, Hepatitis B surface Ag, and Hepatitis core Ab. Hepatitis C Ab negative. Patient will follow up with ID as outpatient  Issues for Follow Up:  1. F/u HR - we started metoprolol here due to history of palpitations. Sinus tachycardia here most consistent with dehydration given improvement with IVF. Patient will follow up with cardiology for work up of her palpitations 2. F/U Abscesses - s/p I&D but were not packed due to patient intolerance. Discharged on bactrim to complete a total 14 day course 3. Patient will have follow up with ID for further management of HIV, and chronic HBV.   Significant Procedures: I&D in ED 08/26/2014  Significant Labs and Imaging:   Recent Labs Lab 08/26/14 1148 08/27/14 0718  WBC 8.4 4.6  HGB 14.0 11.6*  HCT 39.8 34.4*  PLT 241 212    Recent Labs Lab 08/26/14 1148 08/27/14 0718  NA 133* 137  K 3.2* 3.6  CL 102 109  CO2 23 21  GLUCOSE 106* 83  BUN 11 <5*  CREATININE 0.82 0.65  CALCIUM 9.1 9.0   Lactic Acid 2.82 > 1.4 TSH 1.386 BNP 89.5  Urine pregnancy: Negative  Abscess culture: Staph aureus  Results/Tests Pending at Time of Discharge: Blood culture  Discharge Medications:    Medication List    TAKE these medications        metoprolol succinate 25 MG 24 hr tablet  Commonly known as:  TOPROL-XL  Take 1 tablet (25 mg total) by mouth daily.  Notes to Patient:  This is  a new blood pressure/ heart rate medication      sulfamethoxazole-trimethoprim 800-160 MG per tablet  Commonly known as:  BACTRIM DS,SEPTRA DS  Take 1 tablet by mouth 2 (two) times daily.  Notes to Patient:  Antibiotic         Discharge Instructions: Please refer to Patient Instructions section of EMR for full details.  Patient was counseled important signs and symptoms that should prompt return to medical care, changes in medications, dietary instructions, activity restrictions, and follow up appointments.   Follow-Up Appointments: Follow-up Information    Follow up with Araceli BoucheRumley, [place] N, DO On 09/04/2014.   Specialty:  Family Medicine   Why:  2:00pm   Contact information:   [address] [place] [address] [phone]       Ardith Darkaleb M Roper Tolson, MD 08/27/2014, 3:44 PM PGY-1, [facility]

## 2014-08-28 LAB — CULTURE, ROUTINE-ABSCESS: Gram Stain: NONE SEEN

## 2014-08-28 LAB — HIV-1 GENOTYPR PLUS

## 2014-08-29 ENCOUNTER — Telehealth: Payer: Self-pay | Admitting: Family Medicine

## 2014-08-29 MED ORDER — CEPHALEXIN 500 MG PO CAPS: 500.0000 mg | ORAL_CAPSULE | Freq: Four times a day (QID) | ORAL | Status: DC

## 2014-08-29 NOTE — Telephone Encounter (Signed)
The patient was discharged with Bactrim for an abscess, however the isolate is resistant to this. I switched her to Keflex 500mg  QID x 14 days. A voicemail was left on her home phone number with a Sw[place]ili [place] interpreter [phone] informing her of the new prescription and to discontinue to Bactrim.   Joanna Puffrystal S. Ammie Warrick, MD [facility] Resident  08/29/2014, 7:17 PM

## 2014-09-01 LAB — CULTURE, BLOOD (ROUTINE X 2)
Culture: NO GROWTH
Culture: NO GROWTH

## 2014-09-03 ENCOUNTER — Other Ambulatory Visit: Payer: Self-pay | Admitting: Infectious Disease

## 2014-09-03 DIAGNOSIS — B20 Human immunodeficiency virus [HIV] disease: Secondary | ICD-10-CM

## 2014-09-04 ENCOUNTER — Encounter: Payer: Self-pay | Admitting: Family Medicine

## 2014-09-04 ENCOUNTER — Ambulatory Visit (INDEPENDENT_AMBULATORY_CARE_PROVIDER_SITE_OTHER): Payer: Medicaid Other | Admitting: Family Medicine

## 2014-09-04 VITALS — BP 128/100 | HR 104 | Temp 98.6°F | Ht 60.0 in | Wt 89.0 lb

## 2014-09-04 DIAGNOSIS — L732 Hidradenitis suppurativa: Secondary | ICD-10-CM

## 2014-09-04 NOTE — Progress Notes (Signed)
Subjective:     Patient ID: Marie Olson, female   DOB: 03/08/1991, 24 y.o.   MRN: [id number]  HPI [name] is a 24yo female presenting for hospital followup. Swahili interpreter used throughout encounter. - Hospitalized from 08/26/14 to 08/27/14 with bilateral axillary abscesses s/p I&D and sinus tachycardia. - Discharged with Bactrim. Sensitivities showed resistance, so was switched to Keflex 500mg  qid x14 days. - Denies pain, fever - Has not been taking Keflex, not at pharmacy so she was never able to pick this up - States abscesses under arms have resolved. Denies pain. - Denies fever - Denies palpitations, chest pain, and shortness of breath since discharge. Discharged with Metoprolol 25mg . - Scheduled to follow up with Cardiology on 09/05/14 - Scheduled to follow up with Infectious Disease on 09/10/14  Review of Systems  Constitutional: Negative for fever.  Respiratory: Negative for shortness of breath.   Cardiovascular: Negative for chest pain and palpitations.       Objective:   Physical Exam  Constitutional: She appears well-developed and well-nourished. No distress.  Cardiovascular: Regular rhythm.   No murmur heard. Tachycardic  Pulmonary/Chest: Effort normal. No respiratory distress. She has no wheezes. She has no rales.  Abdominal: Soft. She exhibits no distension. There is no tenderness.  Lymphadenopathy:    She has no cervical adenopathy.  Skin:  No axillary abscess noted. Scarring appears to be secondary to hidradenitis suppurativa       Assessment:     Please refer to Problem List for Assessment.    Plan:     Please refer to Problem List for Plan.

## 2014-09-04 NOTE — Patient Instructions (Addendum)
Thank you so much for coming to see me today! - You do not need antibiotics at this time. If you develop abscess again, please come to the office for antibiotics. - Please go to you cardiology appointment (Heart) tomorrow (09/05/14) at 3:45pm. The office is at [address], [place], [place] [address] - Please go to your infectious disease appointment on Monday (09/10/14) at 9:30am. The office is at [address]  Please let me know if you have any questions or have trouble getting your medicine.  Dr. Caroleen Hammanumley

## 2014-09-05 ENCOUNTER — Encounter: Payer: Self-pay | Admitting: Internal Medicine

## 2014-09-05 ENCOUNTER — Ambulatory Visit (INDEPENDENT_AMBULATORY_CARE_PROVIDER_SITE_OTHER): Payer: Medicaid Other | Admitting: Internal Medicine

## 2014-09-05 VITALS — BP 140/96 | HR 88 | Ht 59.75 in | Wt 94.4 lb

## 2014-09-05 DIAGNOSIS — R002 Palpitations: Secondary | ICD-10-CM | POA: Diagnosis not present

## 2014-09-05 DIAGNOSIS — R000 Tachycardia, unspecified: Secondary | ICD-10-CM | POA: Diagnosis not present

## 2014-09-05 DIAGNOSIS — L0390 Cellulitis, unspecified: Secondary | ICD-10-CM

## 2014-09-05 DIAGNOSIS — B20 Human immunodeficiency virus [HIV] disease: Secondary | ICD-10-CM | POA: Diagnosis not present

## 2014-09-05 DIAGNOSIS — L732 Hidradenitis suppurativa: Secondary | ICD-10-CM | POA: Insufficient documentation

## 2014-09-05 DIAGNOSIS — L0291 Cutaneous abscess, unspecified: Secondary | ICD-10-CM

## 2014-09-05 NOTE — Patient Instructions (Addendum)
Dr Hilty recommends that you follow-up with him as needed. 

## 2014-09-05 NOTE — Assessment & Plan Note (Signed)
-   Will not give antibiotics at this time. Abscesses have resolved and no fevers noted. Will prescribed antibiotics for future flares. I&D contraindicated.

## 2014-09-05 NOTE — Progress Notes (Signed)
Thanks for seeing her

## 2014-09-05 NOTE — Progress Notes (Signed)
OFFICE NOTE  Chief Complaint:  Tachycardia, palpitations  Primary Care Physician: Jacquelin HawkingNettey, Ralph, MD  HPI:  Marie Olson is a pleasant 24 year old female from [place] who is here today with a translator who speaks Swahili. Through translation I was able to obtain records including her hospital discharge work which demonstrated that she was recently in the hospital and developed abscess and cellulitis. She also has underlying HIV disease and is [facility] by Dr. Daiva EvesVan Dam. During her hospitalization she was noted to be tachycardic and was placed on a beta blocker for her palpitations. She says since taking that medicine her symptoms have significantly improved. She feels like she is getting stronger every day. In the hospital she underwent an echocardiogram which showed hyperdynamic LV systolic function with normal diastolic function. No wall motion abnormalities were noted.  PMHx:  Past Medical History  Diagnosis Date  . HIV (human immunodeficiency virus infection)   . Abscess     History reviewed. No pertinent past surgical history.  FAMHx:  Family History  Problem Relation Age of Onset  . Family history unknown: Yes    SOCHx:   reports that she has never smoked. She does not have any smokeless tobacco history on file. She reports that she does not drink alcohol. Her drug history is not on file.  ALLERGIES:  No Known Allergies  ROS: A comprehensive review of systems was negative except for: Cardiovascular: positive for palpitations  HOME MEDS: Current Outpatient Prescriptions  Medication Sig Dispense Refill  . cephALEXin (KEFLEX) 500 MG capsule Take 1 capsule (500 mg total) by mouth 4 (four) times daily. 56 capsule 0  . metoprolol succinate (TOPROL-XL) 25 MG 24 hr tablet Take 1 tablet (25 mg total) by mouth daily. 30 tablet 0  . sulfamethoxazole-trimethoprim (BACTRIM DS,SEPTRA DS) 800-160 MG per tablet Take 1 tablet by mouth 2  (two) times daily. 25 tablet 0   No current facility-administered medications for this visit.    LABS/IMAGING: No results found for this or any previous visit (from the past 48 hour(s)). No results found.  WEIGHTS: Wt Readings from Last 3 Encounters:  09/05/14 94 lb 6.4 oz (42.82 kg)  09/04/14 89 lb (40.37 kg)  08/26/14 98 lb 11.2 oz (44.77 kg)    VITALS: BP 140/96 mmHg  Pulse 88  Ht 4' 11.75" (1.518 m)  Wt 94 lb 6.4 oz (42.82 kg)  BMI 18.58 kg/m2  LMP 08/25/2014 (Exact Date)  EXAM: General appearance: alert, no distress and Thin appearing Neck: no carotid bruit, no JVD and thyroid not enlarged, symmetric, no tenderness/mass/nodules Lungs: clear to auscultation bilaterally Heart: regular rate and rhythm, S1, S2 normal, no murmur, click, rub or gallop Abdomen: soft, non-tender; bowel sounds normal; no masses,  no organomegaly Extremities: extremities normal, atraumatic, no cyanosis or edema Pulses: 2+ and symmetric Skin: Skin color, texture, turgor normal. No rashes or lesions Neurologic: Grossly normal Psych: Pleasant  EKG: Deferred  ASSESSMENT: 1. Tachycardia and palpitations 2. HIV disease 3. Abscess and cellulitis  PLAN: 1.   [name] had tachycardia and palpitations which may be related to her underlying disease process. Fortunately her echocardiogram was reassuring showing normal systolic function and normal diastolic function. She seems to be mildly hypertensive and probably will benefit from ongoing use of her metoprolol. She reports significant improvement in her symptoms with a marked decrease in palpitations. I recommend continuing her beta blocker and continue back on an as-needed basis.  Thanks  for the consultation.  Chrystie NoseKenneth C. Hilty, MD, [facility] Attending Cardiologist [facility]  Chrystie NoseKenneth C Hilty 09/05/2014, 6:31 PM

## 2014-09-06 NOTE — Progress Notes (Signed)
I was available as preceptor to resident for this patient's office visit.  

## 2014-09-10 ENCOUNTER — Ambulatory Visit (INDEPENDENT_AMBULATORY_CARE_PROVIDER_SITE_OTHER): Payer: Medicaid Other | Admitting: Infectious Disease

## 2014-09-10 ENCOUNTER — Encounter: Payer: Self-pay | Admitting: Infectious Disease

## 2014-09-10 ENCOUNTER — Other Ambulatory Visit: Payer: Self-pay | Admitting: *Deleted

## 2014-09-10 VITALS — BP 138/82 | HR 93 | Temp 97.3°F | Wt 94.0 lb

## 2014-09-10 DIAGNOSIS — B20 Human immunodeficiency virus [HIV] disease: Secondary | ICD-10-CM | POA: Diagnosis present

## 2014-09-10 DIAGNOSIS — R002 Palpitations: Secondary | ICD-10-CM

## 2014-09-10 DIAGNOSIS — B181 Chronic viral hepatitis B without delta-agent: Secondary | ICD-10-CM

## 2014-09-10 HISTORY — DX: Chronic viral hepatitis B without delta-agent: B18.1

## 2014-09-10 MED ORDER — METOPROLOL SUCCINATE ER 25 MG PO TB24: 25.0000 mg | ORAL_TABLET | Freq: Every day | ORAL | Status: DC

## 2014-09-10 MED ORDER — ELVITEG-COBIC-EMTRICIT-TENOFAF 150-150-200-10 MG PO TABS: 1.0000 | ORAL_TABLET | Freq: Every day | ORAL | Status: DC

## 2014-09-10 NOTE — Progress Notes (Signed)
   Subjective:    Patient ID: Marie Olson, female    DOB: 09/18/1990, 24 y.o.   MRN: [id number]  HPI  24 year old Swahili speaking African lady with newly diagnosed HIV, here with HIV + husband and daughter. She had fairly high viral load and CD4 just above 200.  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   She suffers additionally from comorbid tachycardia, palpitations but not taking her beta blocker.     Review of Systems  Constitutional: Negative for fever, chills, diaphoresis, activity change, appetite change, fatigue and unexpected weight change.  HENT: Negative for congestion, rhinorrhea, sinus pressure, sneezing, sore throat and trouble swallowing.   Eyes: Negative for photophobia and visual disturbance.  Respiratory: Negative for cough, chest tightness, shortness of breath, wheezing and stridor.   Cardiovascular: Negative for chest pain, palpitations and leg swelling.  Gastrointestinal: Negative for nausea, vomiting, abdominal pain, diarrhea, constipation, blood in stool, abdominal distention and anal bleeding.  Genitourinary: Negative for dysuria, hematuria, flank pain and difficulty urinating.  Musculoskeletal: Negative for myalgias, back pain, joint swelling, arthralgias and gait problem.  Skin: Negative for color change, pallor, rash and wound.  Neurological: Negative for dizziness, tremors, weakness and light-headedness.  Hematological: Negative for adenopathy. Does not bruise/bleed easily.  Psychiatric/Behavioral: Negative for behavioral problems, confusion, sleep disturbance, dysphoric mood, decreased concentration and agitation.       Objective:   Physical Exam  Constitutional: She is oriented to person, place, and time. She appears well-developed and well-nourished. No distress.  HENT:  Head: Normocephalic and atraumatic.  Mouth/Throat: No oropharyngeal exudate.  Eyes: Conjunctivae and EOM are normal. No scleral icterus.  Neck:  Normal range of motion. Neck supple.  Cardiovascular: Normal rate and regular rhythm.   Pulmonary/Chest: Effort normal. No respiratory distress. She has no wheezes.  Abdominal: She exhibits no distension.  Musculoskeletal: She exhibits no edema or tenderness.  Neurological: She is alert and oriented to person, place, and time. She exhibits normal muscle tone. Coordination normal.  Skin: Skin is warm and dry. No rash noted. She is not diaphoretic. No erythema. No pallor.  Psychiatric: She has a normal mood and affect. Her behavior is normal. Judgment and thought content normal.          Assessment & Plan:   HIV disease: had extensive discussion re mainly two ARV STR, GENVOYA and ODEFSEY but had concerns ultimately re the ability to count calories in meal and have meal with sufficient fat in it since she and her husband alternate between meat one day and "all vegetables" the next day. Will go with GENVOYA. I spent greater than 45 minutes with the patient including greater than 50% of time in face to face counsel of the patient re nature of HIV, and specific ARVs used to treat it and in coordination of their care.   Chronic hepatitis B without delta agent and without hepatic coma: To be on covered with TAF/FTC Should have [facility]  Indeterminate IGRA: check PPD on her as well  Palpitations: she has been rx beta blocker will see if still an issue at next visit

## 2014-09-12 LAB — HIV-1 INTEGRASE GENOTYPE

## 2014-11-07 ENCOUNTER — Ambulatory Visit: Payer: Medicaid Other | Admitting: Infectious Disease

## 2014-12-24 ENCOUNTER — Encounter: Payer: Self-pay | Admitting: Infectious Disease

## 2014-12-24 ENCOUNTER — Ambulatory Visit (INDEPENDENT_AMBULATORY_CARE_PROVIDER_SITE_OTHER): Payer: Medicaid Other | Admitting: Infectious Disease

## 2014-12-24 ENCOUNTER — Other Ambulatory Visit (HOSPITAL_COMMUNITY)
Admission: RE | Admit: 2014-12-24 | Discharge: 2014-12-24 | Disposition: A | Discharge status: Home / Self Care | Payer: Medicaid Other | Source: Ambulatory Visit | Attending: Infectious Disease | Admitting: Infectious Disease

## 2014-12-24 VITALS — BP 129/79 | HR 77 | Temp 98.0°F | Ht 60.0 in | Wt 92.0 lb

## 2014-12-24 DIAGNOSIS — Z113 Encounter for screening for infections with a predominantly sexual mode of transmission: Secondary | ICD-10-CM | POA: Diagnosis present

## 2014-12-24 DIAGNOSIS — B20 Human immunodeficiency virus [HIV] disease: Secondary | ICD-10-CM

## 2014-12-24 DIAGNOSIS — B181 Chronic viral hepatitis B without delta-agent: Secondary | ICD-10-CM

## 2014-12-24 DIAGNOSIS — Z603 Acculturation difficulty: Secondary | ICD-10-CM | POA: Diagnosis not present

## 2014-12-24 DIAGNOSIS — Z599 Problem related to housing and economic circumstances, unspecified: Secondary | ICD-10-CM | POA: Diagnosis not present

## 2014-12-24 HISTORY — DX: Problem related to housing and economic circumstances, unspecified: Z59.9

## 2014-12-24 LAB — COMPLETE METABOLIC PANEL WITH GFR
ALT: 11 U/L (ref 6–29)
AST: 22 U/L (ref 10–30)
Albumin: 3.9 g/dL (ref 3.6–5.1)
Alkaline Phosphatase: 61 U/L (ref 33–115)
BUN: 15 mg/dL (ref 7–25)
CALCIUM: 9.1 mg/dL (ref 8.6–10.2)
CO2: 26 mmol/L (ref 20–31)
Chloride: 103 mmol/L (ref 98–110)
Creat: 0.8 mg/dL (ref 0.50–1.10)
GFR, Est African American: >89 mL/min (ref >=60)
GLUCOSE: 99 mg/dL (ref 65–99)
POTASSIUM: 4.2 mmol/L (ref 3.5–5.3)
SODIUM: 139 mmol/L (ref 135–146)
Total Bilirubin: 0.3 mg/dL (ref 0.2–1.2)
Total Protein: 8.9 g/dL — ABNORMAL HIGH (ref 6.1–8.1)

## 2014-12-24 LAB — CBC WITH DIFFERENTIAL/PLATELET
BASOS PCT: 1 % (ref 0–1)
Basophils Absolute: 0 10*3/uL (ref 0.0–0.1)
EOS PCT: 18 % — AB (ref 0–5)
Eosinophils Absolute: 0.9 10*3/uL — ABNORMAL HIGH (ref 0.0–0.7)
HCT: 34.1 % — ABNORMAL LOW (ref 36.0–46.0)
Hemoglobin: 11.9 g/dL — ABNORMAL LOW (ref 12.0–15.0)
Lymphocytes Relative: 47 % — ABNORMAL HIGH (ref 12–46)
Lymphs Abs: 2.3 10*3/uL (ref 0.7–4.0)
MCH: 30.3 pg (ref 26.0–34.0)
MCHC: 34.9 g/dL (ref 30.0–36.0)
MCV: 86.8 fL (ref 78.0–100.0)
MONO ABS: 0.4 10*3/uL (ref 0.1–1.0)
MONOS PCT: 9 % (ref 3–12)
MPV: 9.4 fL (ref 8.6–12.4)
Neutro Abs: 1.2 10*3/uL — ABNORMAL LOW (ref 1.7–7.7)
Neutrophils Relative %: 25 % — ABNORMAL LOW (ref 43–77)
Platelets: 246 10*3/uL (ref 150–400)
RBC: 3.93 MIL/uL (ref 3.87–5.11)
RDW: 13.8 % (ref 11.5–15.5)
WBC: 4.8 10*3/uL (ref 4.0–10.5)

## 2014-12-24 NOTE — Progress Notes (Signed)
Subjective:    Patient ID: Marie Olson, female    DOB: 1990/05/23, 24 y.o.   MRN: [id number]  HPI   24 year old Swahili speaking African lady with newly diagnosed HIV, here with HIV + husband . She had fairly high viral load and CD4 just above 200 when we first checked it.  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  Both she and her husband have missed multiple appointments with me it seems due to a language barrier and they are not understanding when their appointments were. But they've both missed at least 2-3 if not 4 appointments with me since the initial visit.  She claims she is highly adherent to her GENVOYA and has not missed any doses since she started on the medicines in April.  Both she and her husband and children are suffering from inadequate housing and Amy from [employer] is going to assist them.    Review of Systems  Constitutional: Negative for fever, chills, diaphoresis, activity change, appetite change, fatigue and unexpected weight change.  HENT: Negative for congestion, rhinorrhea, sinus pressure, sneezing, sore throat and trouble swallowing.   Eyes: Negative for photophobia and visual disturbance.  Respiratory: Negative for cough, chest tightness, shortness of breath, wheezing and stridor.   Cardiovascular: Negative for chest pain, palpitations and leg swelling.  Gastrointestinal: Negative for nausea, vomiting, abdominal pain, diarrhea, constipation, blood in stool, abdominal distention and anal bleeding.  Genitourinary: Negative for dysuria, hematuria, flank pain and difficulty urinating.  Musculoskeletal: Negative for myalgias, back pain, joint swelling, arthralgias and gait problem.  Skin: Negative for color change, pallor, rash and wound.  Neurological: Negative for tremors, weakness and light-headedness.  Hematological:  Negative for adenopathy. Does not bruise/bleed easily.  Psychiatric/Behavioral: Negative for behavioral problems, confusion, sleep disturbance, dysphoric mood, decreased concentration and agitation.       Objective:   Physical Exam  Constitutional: She is oriented to person, place, and time. She appears well-developed and well-nourished. No distress.  HENT:  Head: Normocephalic and atraumatic.  Mouth/Throat: No oropharyngeal exudate.  Eyes: Conjunctivae and EOM are normal. No scleral icterus.  Neck: Normal range of motion. Neck supple.  Cardiovascular: Normal rate and regular rhythm.   Pulmonary/Chest: Effort normal. No respiratory distress. She has no wheezes.  Abdominal: She exhibits no distension.  Musculoskeletal: She exhibits no edema.  Neurological: She is alert and oriented to person, place, and time. She exhibits normal muscle tone. Coordination normal.  Skin: Skin is warm and dry. No rash noted. She is not diaphoretic. No erythema. No pallor.  Psychiatric: She has a normal mood and affect. Her behavior is normal. Judgment and thought content normal.          Assessment & Plan:   HIV disease: Labs are being checked today we will continue GENVOYA and have her and her husband come back to see me on September 21   Chronic hepatitis B without delta agent and without hepatic coma: To be on covered with TAF/FTC   Indeterminate IGRA: Like her husband I would expect she would've had PPD testing done with immigration and we need to investigate this  Palpitations: she has been rx beta blocker and asks if she it is ok to take with her ARV regimen.  Filed Vitals:   12/24/14 0952  BP: 129/79  Pulse: 77  Temp: 98 F (36.7 C)  I spent greater than 40 minutes with the patient including greater than 50% of time in face to face counsel of the patient with Swahili interpreter re her HIV, her ARV regimen, her palpiations, her housing problems and in coordination of their care.

## 2014-12-25 LAB — HIV RNA, RTPCR W/R GT (RTI, PI,INT)
HIV 1 RNA Quant: 196 copies/mL — ABNORMAL HIGH (ref <20)
HIV-1 RNA QUANT, LOG: 2.29 {Log} — AB (ref <1.30)

## 2014-12-25 LAB — RPR

## 2014-12-25 LAB — T-HELPER CELL (CD4) - (RCID CLINIC ONLY)
CD4 % Helper T Cell: 18 % — ABNORMAL LOW (ref 33–55)
CD4 T CELL ABS: 370 /uL — AB (ref 400–2700)

## 2014-12-26 LAB — URINE CYTOLOGY ANCILLARY ONLY
Chlamydia: NEGATIVE
Neisseria Gonorrhea: NEGATIVE

## 2015-01-23 ENCOUNTER — Ambulatory Visit: Payer: Medicaid Other | Admitting: Infectious Disease

## 2015-02-13 ENCOUNTER — Encounter: Payer: Self-pay | Admitting: Infectious Disease

## 2015-02-13 ENCOUNTER — Ambulatory Visit (INDEPENDENT_AMBULATORY_CARE_PROVIDER_SITE_OTHER): Payer: Self-pay | Admitting: Infectious Disease

## 2015-02-13 VITALS — BP 134/76 | HR 84 | Temp 98.1°F | Wt 92.1 lb

## 2015-02-13 DIAGNOSIS — B181 Chronic viral hepatitis B without delta-agent: Secondary | ICD-10-CM

## 2015-02-13 DIAGNOSIS — Z603 Acculturation difficulty: Secondary | ICD-10-CM

## 2015-02-13 DIAGNOSIS — R002 Palpitations: Secondary | ICD-10-CM

## 2015-02-13 DIAGNOSIS — B20 Human immunodeficiency virus [HIV] disease: Secondary | ICD-10-CM

## 2015-02-13 DIAGNOSIS — Z599 Problem related to housing and economic circumstances, unspecified: Secondary | ICD-10-CM

## 2015-02-13 LAB — COMPLETE METABOLIC PANEL WITH GFR
ALBUMIN: 4.3 g/dL (ref 3.6–5.1)
ALK PHOS: 62 U/L (ref 33–115)
ALT: 10 U/L (ref 6–29)
AST: 24 U/L (ref 10–30)
BUN: 11 mg/dL (ref 7–25)
CALCIUM: 9.3 mg/dL (ref 8.6–10.2)
CHLORIDE: 103 mmol/L (ref 98–110)
CO2: 23 mmol/L (ref 20–31)
Creat: 0.7 mg/dL (ref 0.50–1.10)
Glucose, Bld: 76 mg/dL (ref 65–99)
POTASSIUM: 3.7 mmol/L (ref 3.5–5.3)
Sodium: 136 mmol/L (ref 135–146)
Total Bilirubin: 0.4 mg/dL (ref 0.2–1.2)
Total Protein: 8.7 g/dL — ABNORMAL HIGH (ref 6.1–8.1)

## 2015-02-13 LAB — CBC WITH DIFFERENTIAL/PLATELET
BASOS ABS: 0.1 10*3/uL (ref 0.0–0.1)
BASOS PCT: 1 % (ref 0–1)
Eosinophils Absolute: 1 10*3/uL — ABNORMAL HIGH (ref 0.0–0.7)
Eosinophils Relative: 18 % — ABNORMAL HIGH (ref 0–5)
HCT: 37.6 % (ref 36.0–46.0)
HEMOGLOBIN: 13.3 g/dL (ref 12.0–15.0)
LYMPHS PCT: 40 % (ref 12–46)
Lymphs Abs: 2.3 10*3/uL (ref 0.7–4.0)
MCH: 30.6 pg (ref 26.0–34.0)
MCHC: 35.4 g/dL (ref 30.0–36.0)
MCV: 86.4 fL (ref 78.0–100.0)
MONO ABS: 0.7 10*3/uL (ref 0.1–1.0)
MPV: 10.1 fL (ref 8.6–12.4)
Monocytes Relative: 12 % (ref 3–12)
NEUTROS ABS: 1.7 10*3/uL (ref 1.7–7.7)
Neutrophils Relative %: 29 % — ABNORMAL LOW (ref 43–77)
Platelets: 265 10*3/uL (ref 150–400)
RBC: 4.35 MIL/uL (ref 3.87–5.11)
RDW: 14.1 % (ref 11.5–15.5)
WBC: 5.8 10*3/uL (ref 4.0–10.5)

## 2015-02-13 NOTE — Progress Notes (Signed)
Chief complaint: Dental pain Subjective:    Patient ID: Marie Olson, female    DOB: June 28, 1990, 24 y.o.   MRN: [id number]  HPI   24 year old Swahili speaking African lady with newly diagnosed HIV, here with HIV + husband . She had fairly high viral load and CD4 just above 200 when we first checked it.  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  Both she and her husband have missed multiple appointments with me it seems due to a language barrier and they are not understanding when their appointments were.   She claims she is highly adherent to her GENVOYA and has not missed any doses since she started on the medicines in April.  Both she and her husband and children had been suffering from inadequate housing and Amy from [facility] is going to assist them.  Her viral load had come under reasonable control though not as suppressed as it should've been and her T-cell count is fairly healthy.  Lab Results  Component Value Date   HIV1RNAQUANT 196* 12/24/2014    Lab Results  Component Value Date   CD4TABS 370* 12/24/2014   CD4TABS 220* 08/17/2014   She has complaints of some pain in her teeth and wants to see the dentist.  Past Medical History  Diagnosis Date  . HIV (human immunodeficiency virus infection) (HCC)   . Abscess   . Hypertension   . 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 Housing problems 12/24/2014    No past surgical history on file.  Family History  Problem Relation Age of Onset  . Family history unknown: Yes      Social History   Social History  . Marital Status: Married    Spouse Name: N/A  . Number of Children: N/A  . Years of Education: N/A   Social History Main Topics  . Smoking status: Never Smoker   . Smokeless tobacco: None  . Alcohol Use: No  . Drug Use: None  .  Sexual Activity:    Partners: Male    Pharmacist, hospital Protection: None   Other Topics Concern  . None   Social History Narrative    No Known Allergies   Current outpatient prescriptions:  .  elvitegravir-cobicistat-emtricitabine-tenofovir (GENVOYA) 150-150-200-10 MG TABS tablet, Take 1 tablet by mouth daily with breakfast., Disp: 30 tablet, Rfl: 11 .  metoprolol succinate (TOPROL-XL) 25 MG 24 hr tablet, Take 1 tablet (25 mg total) by mouth daily., Disp: 180 tablet, Rfl: 2   Review of Systems  Constitutional: Negative for fever, chills, diaphoresis, activity change, appetite change, fatigue and unexpected weight change.  HENT: Negative for congestion, rhinorrhea, sinus pressure, sneezing, sore throat and trouble swallowing.   Eyes: Negative for photophobia and visual disturbance.  Respiratory: Negative for cough, chest tightness, shortness of breath, wheezing and stridor.   Cardiovascular: Negative for chest pain, palpitations and leg swelling.  Gastrointestinal: Negative for nausea, vomiting, abdominal pain, diarrhea, constipation, blood in stool, abdominal distention and anal bleeding.  Genitourinary: Negative for dysuria, hematuria, flank pain and difficulty urinating.  Musculoskeletal: Negative for myalgias, back pain, joint swelling, arthralgias and gait problem.  Skin: Negative for color change, pallor, rash and wound.  Neurological: Negative for tremors, weakness and light-headedness.  Hematological: Negative for adenopathy. Does not bruise/bleed easily.  Psychiatric/Behavioral: Negative for behavioral problems, confusion, sleep disturbance,  dysphoric mood, decreased concentration and agitation.       Objective:   Physical Exam  Constitutional: She is oriented to person, place, and time. She appears well-developed and well-nourished. No distress.  HENT:  Head: Normocephalic and atraumatic.  Mouth/Throat: Uvula is midline and oropharynx is clear and moist. No oropharyngeal  exudate.  Eyes: Conjunctivae and EOM are normal. No scleral icterus.  Neck: Normal range of motion. Neck supple.  Cardiovascular: Normal rate and regular rhythm.   Pulmonary/Chest: Effort normal. No respiratory distress. She has no wheezes.  Abdominal: She exhibits no distension.  Musculoskeletal: She exhibits no edema.  Neurological: She is alert and oriented to person, place, and time. She exhibits normal muscle tone. Coordination normal.  Skin: Skin is warm and dry. No rash noted. She is not diaphoretic. No erythema. No pallor.  Psychiatric: She has a normal mood and affect. Her behavior is normal. Judgment and thought content normal.          Assessment & Plan:   HIV disease: Recheck labs today will continue GENVOYA. She no longer has Medicaid of [place] correctly we will make sure that she enrolls in [facility] and [facility]  Chronic hepatitis B without delta agent and without hepatic coma: To be on covered with [employer]   Palpitations: she has been rx beta blocker and asks if she it is ok to take with her ARV regimen.  Filed Vitals:   02/13/15 0931  BP: 134/76  Pulse: 84  Temp: 98.1 F (36.7 C)    I spent greater than 25  minutes with the patient including greater than 50% of time in face to face counsel of the patient with Swahili interpreter re her HIV, her ARV regimen, her palpiations,and in coordination of their care.

## 2015-02-14 LAB — RPR

## 2015-02-14 LAB — URINE CYTOLOGY ANCILLARY ONLY
CHLAMYDIA, DNA PROBE: NEGATIVE
Neisseria Gonorrhea: NEGATIVE

## 2015-02-15 LAB — HIV RNA, RTPCR W/R GT (RTI, PI,INT)
HIV 1 RNA QUANT: 1461 {copies}/mL — AB (ref <20)
HIV-1 RNA Quant, Log: 3.16 {Log} — ABNORMAL HIGH (ref <1.30)

## 2015-02-25 ENCOUNTER — Telehealth: Payer: Self-pay | Admitting: *Deleted

## 2015-02-25 NOTE — Telephone Encounter (Signed)
-----   Message from Randall Hissornelius N Van Dam, MD sent at 02/15/2015 11:28 AM EDT ----- I need Marie Olson to come back for repeat labs in next 2-4 weeks and then visit with me. It may need to be soner if she has failed with resistance

## 2015-02-25 NOTE — Telephone Encounter (Signed)
If we do not hear back then we should ask bridge counselor to help out

## 2015-02-25 NOTE — Telephone Encounter (Signed)
Left message with sponsor. Patient's phone number in EPIC is not correct. Marie Olson

## 2015-02-26 NOTE — Telephone Encounter (Signed)
Excellent

## 2015-02-26 NOTE — Telephone Encounter (Signed)
Referral made to [facility]. Marie Olson  

## 2015-03-06 LAB — HIV-1 GENOTYPING (RTI,PI,IN INHBTR): HIV-1 GENOTYPE: DETECTED

## 2015-04-15 ENCOUNTER — Ambulatory Visit: Payer: Self-pay | Admitting: Infectious Disease

## 2015-04-22 ENCOUNTER — Telehealth: Payer: Self-pay | Admitting: *Deleted

## 2015-04-22 NOTE — Telephone Encounter (Signed)
Patient's friend calling to make appointments for the patient.  Labs 12/20, follow up 1/11 with Dr. Daiva EvesVan Dam. Patient's sponsor [facility] needed to [facility] the [facility].  Patient states she is taking medication.  Updated phone number. Andree CossHowell, Cylinda Santoli M, RN

## 2015-04-23 ENCOUNTER — Other Ambulatory Visit: Payer: Self-pay

## 2015-04-24 ENCOUNTER — Telehealth: Payer: Self-pay

## 2015-04-24 ENCOUNTER — Other Ambulatory Visit: Payer: Medicaid Other

## 2015-04-24 DIAGNOSIS — B20 Human immunodeficiency virus [HIV] disease: Secondary | ICD-10-CM

## 2015-04-24 LAB — COMPLETE METABOLIC PANEL WITH GFR
ALT: 8 U/L (ref 6–29)
AST: 21 U/L (ref 10–30)
Albumin: 4.1 g/dL (ref 3.6–5.1)
Alkaline Phosphatase: 61 U/L (ref 33–115)
BUN: 15 mg/dL (ref 7–25)
CALCIUM: 9.7 mg/dL (ref 8.6–10.2)
CHLORIDE: 100 mmol/L (ref 98–110)
CO2: 22 mmol/L (ref 20–31)
Creat: 0.67 mg/dL (ref 0.50–1.10)
Glucose, Bld: 97 mg/dL (ref 65–99)
POTASSIUM: 3.5 mmol/L (ref 3.5–5.3)
Sodium: 132 mmol/L — ABNORMAL LOW (ref 135–146)
Total Bilirubin: 0.3 mg/dL (ref 0.2–1.2)
Total Protein: 9 g/dL — ABNORMAL HIGH (ref 6.1–8.1)

## 2015-04-24 LAB — CBC WITH DIFFERENTIAL/PLATELET
BASOS ABS: 0 10*3/uL (ref 0.0–0.1)
Basophils Relative: 1 % (ref 0–1)
EOS PCT: 11 % — AB (ref 0–5)
Eosinophils Absolute: 0.5 10*3/uL (ref 0.0–0.7)
HEMATOCRIT: 38 % (ref 36.0–46.0)
HEMOGLOBIN: 13 g/dL (ref 12.0–15.0)
LYMPHS PCT: 36 % (ref 12–46)
Lymphs Abs: 1.7 10*3/uL (ref 0.7–4.0)
MCH: 30.2 pg (ref 26.0–34.0)
MCHC: 34.2 g/dL (ref 30.0–36.0)
MCV: 88.2 fL (ref 78.0–100.0)
MPV: 9.2 fL (ref 8.6–12.4)
Monocytes Absolute: 0.3 10*3/uL (ref 0.1–1.0)
Monocytes Relative: 7 % (ref 3–12)
NEUTROS ABS: 2.1 10*3/uL (ref 1.7–7.7)
NEUTROS PCT: 45 % (ref 43–77)
Platelets: 369 10*3/uL (ref 150–400)
RBC: 4.31 MIL/uL (ref 3.87–5.11)
RDW: 13.3 % (ref 11.5–15.5)
WBC: 4.6 10*3/uL (ref 4.0–10.5)

## 2015-04-25 LAB — URINE CYTOLOGY ANCILLARY ONLY
Chlamydia: NEGATIVE
NEISSERIA GONORRHEA: NEGATIVE

## 2015-04-25 LAB — T-HELPER CELL (CD4) - (RCID CLINIC ONLY)
CD4 % Helper T Cell: 22 % — ABNORMAL LOW (ref 33–55)
CD4 T CELL ABS: 350 /uL — AB (ref 400–2700)

## 2015-04-25 LAB — RPR

## 2015-04-26 LAB — HIV-1 RNA QUANT-NO REFLEX-BLD

## 2015-05-15 ENCOUNTER — Ambulatory Visit: Payer: Self-pay | Admitting: Infectious Disease

## 2015-06-03 ENCOUNTER — Encounter (HOSPITAL_COMMUNITY): Payer: Self-pay

## 2015-06-03 ENCOUNTER — Observation Stay (HOSPITAL_COMMUNITY)
Admission: AD | Admit: 2015-06-03 | Discharge: 2015-06-04 | Disposition: A | Discharge status: Home / Self Care | Payer: Self-pay | Source: Ambulatory Visit | Attending: Family Medicine | Admitting: Family Medicine

## 2015-06-03 ENCOUNTER — Encounter (HOSPITAL_COMMUNITY)
Admission: AD | Disposition: A | Discharge status: Home / Self Care | Payer: Self-pay | Source: Ambulatory Visit | Attending: Family Medicine

## 2015-06-03 ENCOUNTER — Inpatient Hospital Stay (HOSPITAL_COMMUNITY): Payer: Self-pay | Admitting: Anesthesiology

## 2015-06-03 ENCOUNTER — Inpatient Hospital Stay (HOSPITAL_COMMUNITY): Payer: Self-pay

## 2015-06-03 DIAGNOSIS — O4690 Antepartum hemorrhage, unspecified, unspecified trimester: Secondary | ICD-10-CM | POA: Insufficient documentation

## 2015-06-03 DIAGNOSIS — O001 Tubal pregnancy without intrauterine pregnancy: Principal | ICD-10-CM | POA: Insufficient documentation

## 2015-06-03 DIAGNOSIS — O00109 Unspecified tubal pregnancy without intrauterine pregnancy: Secondary | ICD-10-CM | POA: Diagnosis present

## 2015-06-03 DIAGNOSIS — O209 Hemorrhage in early pregnancy, unspecified: Secondary | ICD-10-CM

## 2015-06-03 DIAGNOSIS — D62 Acute posthemorrhagic anemia: Secondary | ICD-10-CM | POA: Diagnosis present

## 2015-06-03 DIAGNOSIS — B20 Human immunodeficiency virus [HIV] disease: Secondary | ICD-10-CM | POA: Insufficient documentation

## 2015-06-03 DIAGNOSIS — Z79899 Other long term (current) drug therapy: Secondary | ICD-10-CM | POA: Insufficient documentation

## 2015-06-03 DIAGNOSIS — B181 Chronic viral hepatitis B without delta-agent: Secondary | ICD-10-CM | POA: Insufficient documentation

## 2015-06-03 DIAGNOSIS — I10 Essential (primary) hypertension: Secondary | ICD-10-CM | POA: Insufficient documentation

## 2015-06-03 HISTORY — PX: DIAGNOSTIC LAPAROSCOPY WITH REMOVAL OF ECTOPIC PREGNANCY: SHX6449

## 2015-06-03 LAB — WET PREP, GENITAL
SPERM: NONE SEEN
TRICH WET PREP: NONE SEEN
Yeast Wet Prep HPF POC: NONE SEEN

## 2015-06-03 LAB — CBC
HCT: 17 % — ABNORMAL LOW (ref 36.0–46.0)
Hemoglobin: 5.8 g/dL — CL (ref 12.0–15.0)
MCH: 31.2 pg (ref 26.0–34.0)
MCHC: 34.1 g/dL (ref 30.0–36.0)
MCV: 91.4 fL (ref 78.0–100.0)
PLATELETS: 277 10*3/uL (ref 150–400)
RBC: 1.86 MIL/uL — AB (ref 3.87–5.11)
RDW: 14.1 % (ref 11.5–15.5)
WBC: 11.7 10*3/uL — AB (ref 4.0–10.5)

## 2015-06-03 LAB — PREPARE RBC (CROSSMATCH)

## 2015-06-03 LAB — GLUCOSE, CAPILLARY: GLUCOSE-CAPILLARY: 184 mg/dL — AB (ref 65–99)

## 2015-06-03 LAB — HCG, QUANTITATIVE, PREGNANCY: hCG, Beta Chain, Quant, S: 51587 m[IU]/mL — ABNORMAL HIGH (ref <5)

## 2015-06-03 SURGERY — LAPAROSCOPY, WITH ECTOPIC PREGNANCY SURGICAL TREATMENT
Anesthesia: General | Site: Abdomen

## 2015-06-03 MED ORDER — SODIUM CHLORIDE 0.9 % IV SOLN
Freq: Once | INTRAVENOUS | Status: AC
  Administered 2015-06-03: 23:00:00 via INTRAVENOUS

## 2015-06-03 MED ORDER — ROCURONIUM BROMIDE 100 MG/10ML IV SOLN
INTRAVENOUS | Status: DC | PRN
  Administered 2015-06-03: 30 mg via INTRAVENOUS

## 2015-06-03 MED ORDER — FAMOTIDINE IN NACL 20-0.9 MG/50ML-% IV SOLN
20.0000 mg | Freq: Once | INTRAVENOUS | Status: AC
  Administered 2015-06-03: 20 mg via INTRAVENOUS
  Filled 2015-06-03: qty 50

## 2015-06-03 MED ORDER — LACTATED RINGERS IV BOLUS (SEPSIS): 1000.0000 mL | Freq: Once | INTRAVENOUS | Status: AC

## 2015-06-03 MED ORDER — CITRIC ACID-SODIUM CITRATE 334-500 MG/5ML PO SOLN
30.0000 mL | Freq: Once | ORAL | Status: AC
  Filled 2015-06-03: qty 15

## 2015-06-03 MED ORDER — SUCCINYLCHOLINE CHLORIDE 20 MG/ML IJ SOLN
INTRAMUSCULAR | Status: DC | PRN
  Administered 2015-06-03: 100 mg via INTRAVENOUS

## 2015-06-03 MED ORDER — MIDAZOLAM HCL 2 MG/2ML IJ SOLN
INTRAMUSCULAR | Status: AC
  Filled 2015-06-03: qty 2

## 2015-06-03 MED ORDER — MIDAZOLAM HCL 2 MG/2ML IJ SOLN
INTRAMUSCULAR | Status: DC | PRN
  Administered 2015-06-03: 2 mg via INTRAVENOUS

## 2015-06-03 MED ORDER — ETOMIDATE 2 MG/ML IV SOLN
INTRAVENOUS | Status: DC | PRN
  Administered 2015-06-03: 16 mg via INTRAVENOUS

## 2015-06-03 MED ORDER — LACTATED RINGERS IV SOLN
INTRAVENOUS | Status: DC
  Administered 2015-06-03 – 2015-06-04 (×3): via INTRAVENOUS

## 2015-06-03 MED ORDER — ETOMIDATE 2 MG/ML IV SOLN
INTRAVENOUS | Status: AC
  Filled 2015-06-03: qty 10

## 2015-06-03 MED ORDER — FENTANYL CITRATE (PF) 100 MCG/2ML IJ SOLN
INTRAMUSCULAR | Status: DC | PRN
  Administered 2015-06-03 (×3): 50 ug via INTRAVENOUS
  Administered 2015-06-03 (×2): 25 ug via INTRAVENOUS
  Administered 2015-06-04: 50 ug via INTRAVENOUS

## 2015-06-03 MED ORDER — FENTANYL CITRATE (PF) 250 MCG/5ML IJ SOLN
INTRAMUSCULAR | Status: AC
  Filled 2015-06-03: qty 5

## 2015-06-03 SURGICAL SUPPLY — 25 items
CABLE HIGH FREQUENCY MONO STRZ (ELECTRODE) IMPLANT
CHLORAPREP W/TINT 26ML (MISCELLANEOUS) ×3 IMPLANT
CLOSURE WOUND 1/2 X4 (GAUZE/BANDAGES/DRESSINGS)
CLOTH BEACON ORANGE TIMEOUT ST (SAFETY) ×3 IMPLANT
GLOVE BIO SURGEON STRL SZ 6.5 (GLOVE) ×2 IMPLANT
GLOVE BIO SURGEONS STRL SZ 6.5 (GLOVE) ×1
GLOVE BIOGEL PI IND STRL 7.0 (GLOVE) ×2 IMPLANT
GLOVE BIOGEL PI INDICATOR 7.0 (GLOVE) ×4
GOWN STRL REUS W/TWL LRG LVL3 (GOWN DISPOSABLE) ×6 IMPLANT
LIQUID BAND (GAUZE/BANDAGES/DRESSINGS) ×3 IMPLANT
NEEDLE INSUFFLATION 120MM (ENDOMECHANICALS) ×3 IMPLANT
NS IRRIG 1000ML POUR BTL (IV SOLUTION) IMPLANT
PACK LAPAROSCOPY BASIN (CUSTOM PROCEDURE TRAY) ×3 IMPLANT
POUCH SPECIMEN RETRIEVAL 10MM (ENDOMECHANICALS) ×3 IMPLANT
SET IRRIG TUBING LAPAROSCOPIC (IRRIGATION / IRRIGATOR) ×3 IMPLANT
SHEARS HARMONIC ACE PLUS 36CM (ENDOMECHANICALS) ×3 IMPLANT
SLEEVE XCEL OPT CAN 5 100 (ENDOMECHANICALS) ×3 IMPLANT
STRIP CLOSURE SKIN 1/2X4 (GAUZE/BANDAGES/DRESSINGS) IMPLANT
SUT VICRYL 0 UR6 27IN ABS (SUTURE) ×3 IMPLANT
SUT VICRYL 4-0 PS2 18IN ABS (SUTURE) ×3 IMPLANT
TOWEL OR 17X24 6PK STRL BLUE (TOWEL DISPOSABLE) ×6 IMPLANT
TRAY FOLEY CATH SILVER 14FR (SET/KITS/TRAYS/PACK) ×3 IMPLANT
TROCAR XCEL DIL TIP R 11M (ENDOMECHANICALS) ×3 IMPLANT
WARMER LAPAROSCOPE (MISCELLANEOUS) ×3 IMPLANT
WATER STERILE IRR 1000ML POUR (IV SOLUTION) IMPLANT

## 2015-06-03 NOTE — MAU Note (Signed)
CRITICAL VALUE ALERT  Critical value received:  Hemoglobin 5.8  Date of notification:  06/03/15   Time of notification:  2206  Critical value read back:Yes.    Nurse who received alert:  Sharen Hint [facility]   MD notified (1st page):  Thressa Sheller CNM   Time of first page:  2206 in department  Responding MD:  Thressa Sheller CNM   Time MD responded:  2206

## 2015-06-03 NOTE — H&P (Addendum)
History     CSN: [id number]  Arrival date and time: 06/03/15 2123   First Provider Initiated Contact with Patient 06/03/15 2129      Chief Complaint  Patient presents with  . Vaginal Bleeding   HPI Comments: Marie Olson is a 25 y.o. G4P3 at [redacted]w[redacted]d who presents today with vaginal bleeding. She states that she last had a period in December. She is unsure of the exact day. She started spotting yesterday, and it was heavier today. On review of her chart patient is HIV+ on anti-retrovirals, and she reports taking them consistently. She also has a history of tachycardia. She is unsure if she is taking the metprolol. Language barrier present. Swahili interpreter used.   Vaginal Bleeding The patient's primary symptoms include pelvic pain and vaginal bleeding. This is a new problem. The current episode started today. The problem occurs constantly. The problem has been unchanged. The pain is severe and started this evening. The problem affects both sides. She is pregnant. Associated symptoms include abdominal pain. Pertinent negatives include no chills, constipation, diarrhea, dysuria, fever, frequency, nausea, urgency or vomiting. The vaginal discharge was bloody. The vaginal bleeding is typical of menses. She has not been passing clots. She has not been passing tissue. Nothing aggravates the symptoms. She has tried nothing for the symptoms. She is sexually active. (HIV+)     Past Medical History  Diagnosis Date  . HIV (human immunodeficiency virus infection) (HCC)   . Abscess   . Hypertension   . 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 Housing problems 12/24/2014    History reviewed. No pertinent past surgical history.  Family History  Problem Relation Age of Onset  . Family history unknown: Yes    Social History  Substance Use Topics  . Smoking status: Never Smoker   . Smokeless tobacco: None  . Alcohol Use: No    Allergies: No Known  Allergies  Prescriptions prior to admission  Medication Sig Dispense Refill Last Dose  . elvitegravir-cobicistat-emtricitabine-tenofovir (GENVOYA) 150-150-200-10 MG TABS tablet Take 1 tablet by mouth daily with breakfast. 30 tablet 11 Taking  . metoprolol succinate (TOPROL-XL) 25 MG 24 hr tablet Take 1 tablet (25 mg total) by mouth daily. 180 tablet 2 Taking    Review of Systems  Constitutional: Negative for fever and chills.  Gastrointestinal: Positive for abdominal pain. Negative for nausea, vomiting, diarrhea and constipation.  Genitourinary: Positive for vaginal bleeding and pelvic pain. Negative for dysuria, urgency and frequency.   Physical Exam   Blood pressure 113/75, pulse 137, temperature 97.6 F (36.4 C), temperature source Oral, resp. rate 16, last menstrual period 04/04/2015.  Physical Exam  Nursing note and vitals reviewed. Constitutional: She is oriented to person, place, and time. She appears well-developed and well-nourished. No distress.  HENT:  Head: Normocephalic.  Cardiovascular: Normal rate.   Respiratory: Effort normal.  GI: Soft. There is no tenderness.  Genitourinary:   External: no lesion Vagina: small amount of white discharge Cervix: pink, smooth, no CMT Uterus: NSSC Adnexa: NT   Neurological: She is alert and oriented to person, place, and time.  Skin: Skin is warm and dry.  Psychiatric: She has a normal mood and affect.   Results for orders placed or performed during the hospital encounter of 06/03/15 (from the past 24 hour(s))  CBC     Status: Abnormal   Collection Time: 06/03/15  9:50 PM  Result Value Ref Range   WBC 11.7 (H) 4.0 -  10.5 K/uL   RBC 1.86 (L) 3.87 - 5.11 MIL/uL   Hemoglobin 5.8 (LL) 12.0 - 15.0 g/dL   HCT 16.1 (L) 09.6 - 04.5 %   MCV 91.4 78.0 - 100.0 fL   MCH 31.2 26.0 - 34.0 pg   MCHC 34.1 30.0 - 36.0 g/dL   RDW 40.9 81.1 - 91.4 %   Platelets 277 150 - 400 K/uL  Wet prep, genital     Status: Abnormal   Collection  Time: 06/03/15 10:01 PM  Result Value Ref Range   Yeast Wet Prep HPF POC NONE SEEN NONE SEEN   Trich, Wet Prep NONE SEEN NONE SEEN   Clue Cells Wet Prep HPF POC PRESENT (A) NONE SEEN   WBC, Wet Prep HPF POC MANY (A) NONE SEEN   Sperm NONE SEEN    US Ob Comp Less 14 Wks  06/03/2015  CLINICAL DATA:  Vaginal bleeding and first-trimester pregnancy. EXAM: OBSTETRIC <14 WK ULTRASOUND TECHNIQUE: Transabdominal ultrasound was performed for evaluation of the gestation as well as the maternal uterus and adnexal regions. COMPARISON:  None. FINDINGS: Gestational sac with 11.5 mm crown-rump length fetus in a left adnexa. There is cardiac activity within a 88 beat per minute rate. There is a large volume of adherent clot and particulate fluid in the pelvis. No gestational sac within the uterus. As permitted by adherent clot, the ovaries are negative. Critical Value/emergent results were called by telephone at the time of interpretation on 06/03/2015 at 10:28 pm to Dr. Adrian Blackwater , who verbally acknowledged these results. IMPRESSION: Living left adnexal ectopic pregnancy with hemoperitoneum from rupture/leakage. Electronically Signed   By: Marnee Spring M.D.   On: 06/03/2015 22:32     MAU Course  Procedures  MDM 2214: Dr. Adrian Blackwater notified that official US results pending, but patient has a ruptured ectopic on [place]. Order for type and cross for 2 units, and he will be down to see the patient.   Assessment and Plan  1.  Ruptured Ectopic Pregnancy Discussed results with patient through interpreter.  Patient consented for exploratory laparoscopy with unilateral salpingectomy for living ectopic pregnancy with Dr Debroah Loop.  Discussed risks of infection, pain, injury to adjacent organs, removal of tube.  Pt NPO since 7pm.  SCDs, preop meds, to the OR when ready. 25 y.o. G4P0 with ruptured ectopic pregnancy. Risks and benefits of procedure discussed with patient including bleeding, transfusion, infection, injury to  surrounding organs and need for additional procedures.  Patient verbalized understanding and all questions were answered.[place] interpreter Swahili, she had family present who agreed as well.   Currie Paris Debroah Loop MD 06/03/2015 11:20 PM     Levie Heritage, DO 06/03/2015 10:47 PM

## 2015-06-03 NOTE — MAU Provider Note (Signed)
History     CSN: [id number]  Arrival date and time: 06/03/15 2123   First Provider Initiated Contact with Patient 06/03/15 2129      Chief Complaint  Patient presents with  . Vaginal Bleeding   HPI Comments: Marie Olson is a 25 y.o. G4P3 at [redacted]w[redacted]d who presents today with vaginal bleeding. She states that she last had a period in December. She is unsure of the exact day. She started spotting yesterday, and it was heavier today. On review of her chart patient is HIV+ on anti-retrovirals, and she reports taking them consistently. She also has a history of tachycardia. She is unsure if she is taking the metprolol. Language barrier present. Swahili interpreter used.   Vaginal Bleeding The patient's primary symptoms include pelvic pain and vaginal bleeding. This is a new problem. The current episode started today. The problem occurs constantly. The problem has been unchanged. The pain is severe. The problem affects both sides. She is pregnant. Associated symptoms include abdominal pain. Pertinent negatives include no chills, constipation, diarrhea, dysuria, fever, frequency, nausea, urgency or vomiting. The vaginal discharge was bloody. The vaginal bleeding is typical of menses. She has not been passing clots. She has not been passing tissue. Nothing aggravates the symptoms. She has tried nothing for the symptoms. She is sexually active. (HIV+)     Past Medical History  Diagnosis Date  . HIV (human immunodeficiency virus infection) (HCC)   . Abscess   . Hypertension   . 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 Housing problems 12/24/2014    History reviewed. No pertinent past surgical history.  Family History  Problem Relation Age of Onset  . Family history unknown: Yes    Social History  Substance Use Topics  . Smoking status: Never Smoker   . Smokeless tobacco: None  . Alcohol Use: No    Allergies: No Known Allergies  Prescriptions  prior to admission  Medication Sig Dispense Refill Last Dose  . elvitegravir-cobicistat-emtricitabine-tenofovir (GENVOYA) 150-150-200-10 MG TABS tablet Take 1 tablet by mouth daily with breakfast. 30 tablet 11 Taking  . metoprolol succinate (TOPROL-XL) 25 MG 24 hr tablet Take 1 tablet (25 mg total) by mouth daily. 180 tablet 2 Taking    Review of Systems  Constitutional: Negative for fever and chills.  Gastrointestinal: Positive for abdominal pain. Negative for nausea, vomiting, diarrhea and constipation.  Genitourinary: Positive for vaginal bleeding and pelvic pain. Negative for dysuria, urgency and frequency.   Physical Exam   Blood pressure 113/75, pulse 137, temperature 97.6 F (36.4 C), temperature source Oral, resp. rate 16, last menstrual period 04/04/2015.  Physical Exam  Nursing note and vitals reviewed. Constitutional: She is oriented to person, place, and time. She appears well-developed and well-nourished. No distress.  HENT:  Head: Normocephalic.  Cardiovascular: Normal rate.   Respiratory: Effort normal.  GI: Soft. There is no tenderness.  Genitourinary:   External: no lesion Vagina: small amount of white discharge Cervix: pink, smooth, no CMT Uterus: NSSC Adnexa: NT   Neurological: She is alert and oriented to person, place, and time.  Skin: Skin is warm and dry.  Psychiatric: She has a normal mood and affect.   Results for orders placed or performed during the hospital encounter of 06/03/15 (from the past 24 hour(s))  CBC     Status: Abnormal   Collection Time: 06/03/15  9:50 PM  Result Value Ref Range   WBC 11.7 (H) 4.0 - 10.5 K/uL  RBC 1.86 (L) 3.87 - 5.11 MIL/uL   Hemoglobin 5.8 (LL) 12.0 - 15.0 g/dL   HCT 16.1 (L) 09.6 - 04.5 %   MCV 91.4 78.0 - 100.0 fL   MCH 31.2 26.0 - 34.0 pg   MCHC 34.1 30.0 - 36.0 g/dL   RDW 40.9 81.1 - 91.4 %   Platelets 277 150 - 400 K/uL  Wet prep, genital     Status: Abnormal   Collection Time: 06/03/15 10:01 PM   Result Value Ref Range   Yeast Wet Prep HPF POC NONE SEEN NONE SEEN   Trich, Wet Prep NONE SEEN NONE SEEN   Clue Cells Wet Prep HPF POC PRESENT (A) NONE SEEN   WBC, Wet Prep HPF POC MANY (A) NONE SEEN   Sperm NONE SEEN      MAU Course  Procedures  MDM 2214: Dr. Adrian Blackwater notified that official US results pending, but patient has a ruptured ectopic on [place]. Order for type and cross for 2 units, and he will be down to see the patient.   Assessment and Plan  Ruptured ectopic pregnancy  To the OR  Tawnya Crook 06/03/2015, 9:44 PM

## 2015-06-03 NOTE — Anesthesia Preprocedure Evaluation (Addendum)
Anesthesia Evaluation  Patient identified by MRN, date of birth, ID band Patient awake    Reviewed: Allergy & Precautions, NPO status , Patient's Chart, lab work & pertinent test results  History of Anesthesia Complications Negative for: history of anesthetic complications  Airway Mallampati: II  TM Distance: >3 FB Neck ROM: Full    Dental  (+) Poor Dentition, Dental Advisory Given   Pulmonary neg pulmonary ROS,    breath sounds clear to auscultation       Cardiovascular hypertension, Pt. on medications and Pt. on home beta blockers (-) angina Rhythm:Regular Rate:Tachycardia  '16 ECHO: Normal LVF, EF 65-70%, valves OK   Neuro/Psych negative neurological ROS     GI/Hepatic negative GI ROS, neg GERD  ,(+) Hepatitis -, B  Endo/Other  negative endocrine ROS  Renal/GU negative Renal ROS     Musculoskeletal   Abdominal   Peds  Hematology  (+) Blood dyscrasia (Hb 5.8, plt 277), , HIV,   Anesthesia Other Findings   Reproductive/Obstetrics (+) Pregnancy                           Anesthesia Physical Anesthesia Plan  ASA: III and emergent  Anesthesia Plan: General   Post-op Pain Management:    Induction: Intravenous and Rapid sequence  Airway Management Planned: Oral ETT  Additional Equipment:   Intra-op Plan:   Post-operative Plan: Extubation in OR  Informed Consent: I have reviewed the patients History and Physical, chart, labs and discussed the procedure including the risks, benefits and alternatives for the proposed anesthesia with the patient or authorized representative who has indicated his/her understanding and acceptance.   Dental advisory given  Plan Discussed with: CRNA and Surgeon  Anesthesia Plan Comments: (Plan routine monitors, GETA, transfusion Risk and benefits discussed through telephone translator)        Anesthesia Quick Evaluation

## 2015-06-03 NOTE — MAU Note (Signed)
Patient presents via EMS with vaginal bleeding and syncope.

## 2015-06-04 DIAGNOSIS — O4690 Antepartum hemorrhage, unspecified, unspecified trimester: Secondary | ICD-10-CM | POA: Insufficient documentation

## 2015-06-04 DIAGNOSIS — O00109 Unspecified tubal pregnancy without intrauterine pregnancy: Secondary | ICD-10-CM | POA: Diagnosis present

## 2015-06-04 DIAGNOSIS — O001 Tubal pregnancy without intrauterine pregnancy: Secondary | ICD-10-CM

## 2015-06-04 DIAGNOSIS — D62 Acute posthemorrhagic anemia: Secondary | ICD-10-CM

## 2015-06-04 LAB — CBC
HEMATOCRIT: 27.7 % — AB (ref 36.0–46.0)
HEMATOCRIT: 22.9 % — AB (ref 36.0–46.0)
HEMOGLOBIN: 8.1 g/dL — AB (ref 12.0–15.0)
Hemoglobin: 9.4 g/dL — ABNORMAL LOW (ref 12.0–15.0)
MCH: 29.8 pg (ref 26.0–34.0)
MCH: 30 pg (ref 26.0–34.0)
MCHC: 33.9 g/dL (ref 30.0–36.0)
MCHC: 35.4 g/dL (ref 30.0–36.0)
MCV: 87.9 fL (ref 78.0–100.0)
MCV: 84.8 fL (ref 78.0–100.0)
Platelets: 134 10*3/uL — ABNORMAL LOW (ref 150–400)
Platelets: 122 10*3/uL — ABNORMAL LOW (ref 150–400)
RBC: 3.15 MIL/uL — ABNORMAL LOW (ref 3.87–5.11)
RBC: 2.7 MIL/uL — ABNORMAL LOW (ref 3.87–5.11)
RDW: 13.8 % (ref 11.5–15.5)
RDW: 14.3 % (ref 11.5–15.5)
WBC: 15.4 10*3/uL — ABNORMAL HIGH (ref 4.0–10.5)
WBC: 11.5 10*3/uL — ABNORMAL HIGH (ref 4.0–10.5)

## 2015-06-04 LAB — PROTIME-INR
INR: 1.27 (ref 0.00–1.49)
Prothrombin Time: 16 seconds — ABNORMAL HIGH (ref 11.6–15.2)

## 2015-06-04 LAB — GC/CHLAMYDIA PROBE AMP (~~LOC~~) NOT AT ARMC
Chlamydia: NEGATIVE
Neisseria Gonorrhea: NEGATIVE

## 2015-06-04 LAB — APTT: APTT: 23 s — AB (ref 24–37)

## 2015-06-04 LAB — ABO/RH: ABO/RH(D): A POS

## 2015-06-04 MED ORDER — DEXAMETHASONE SODIUM PHOSPHATE 4 MG/ML IJ SOLN
INTRAMUSCULAR | Status: AC
  Filled 2015-06-04: qty 1

## 2015-06-04 MED ORDER — SUCCINYLCHOLINE CHLORIDE 20 MG/ML IJ SOLN
INTRAMUSCULAR | Status: AC
  Filled 2015-06-04: qty 1

## 2015-06-04 MED ORDER — HYDROMORPHONE HCL 1 MG/ML IJ SOLN: 0.2500 mg | INTRAMUSCULAR | Status: DC | PRN

## 2015-06-04 MED ORDER — MIDAZOLAM HCL 2 MG/2ML IJ SOLN: 0.5000 mg | Freq: Once | INTRAMUSCULAR | Status: DC | PRN

## 2015-06-04 MED ORDER — ONDANSETRON HCL 4 MG/2ML IJ SOLN
INTRAMUSCULAR | Status: AC
  Filled 2015-06-04: qty 2

## 2015-06-04 MED ORDER — ONDANSETRON HCL 4 MG PO TABS: 4.0000 mg | ORAL_TABLET | Freq: Four times a day (QID) | ORAL | Status: DC | PRN

## 2015-06-04 MED ORDER — SCOPOLAMINE 1 MG/3DAYS TD PT72SCOPOLAMINE 1 MG/3DAYS
MEDICATED_PATCH | TRANSDERMAL | Status: DC | PRN
Start: 2015-06-04 — End: 2015-06-04
  Administered 2015-06-04: 1 via TRANSDERMAL

## 2015-06-04 MED ORDER — NEOSTIGMINE METHYLSULFATE 10 MG/10ML IV SOLN
INTRAVENOUS | Status: DC | PRN
  Administered 2015-06-04: 3 mg via INTRAVENOUS

## 2015-06-04 MED ORDER — ALBUMIN HUMAN 5 % IV SOLN
INTRAVENOUS | Status: AC
  Filled 2015-06-04: qty 250

## 2015-06-04 MED ORDER — ONDANSETRON HCL 4 MG/2ML IJ SOLN
4.0000 mg | Freq: Four times a day (QID) | INTRAMUSCULAR | Status: DC | PRN
Start: 2015-06-04 — End: 2015-06-04

## 2015-06-04 MED ORDER — MEPERIDINE HCL 25 MG/ML IJ SOLN: 6.2500 mg | INTRAMUSCULAR | Status: DC | PRN

## 2015-06-04 MED ORDER — PHENYLEPHRINE 40 MCG/ML (10ML) SYRINGE FOR IV PUSH (FOR BLOOD PRESSURE SUPPORT)
PREFILLED_SYRINGE | INTRAVENOUS | Status: AC
  Filled 2015-06-04: qty 10

## 2015-06-04 MED ORDER — HYDROMORPHONE HCL 1 MG/ML IJ SOLN: 1.0000 mg | INTRAMUSCULAR | Status: DC | PRN

## 2015-06-04 MED ORDER — OXYCODONE-ACETAMINOPHEN 5-325 MG PO TABS: 1.0000 | ORAL_TABLET | ORAL | Status: DC | PRN

## 2015-06-04 MED ORDER — LACTATED RINGERS IV SOLN
INTRAVENOUS | Status: DC
  Administered 2015-06-04 (×2): via INTRAVENOUS

## 2015-06-04 MED ORDER — ONDANSETRON HCL 4 MG/2ML IJ SOLN
INTRAMUSCULAR | Status: DC | PRN
  Administered 2015-06-04: 4 mg via INTRAVENOUS

## 2015-06-04 MED ORDER — NEOSTIGMINE METHYLSULFATE 10 MG/10ML IV SOLN
INTRAVENOUS | Status: AC
  Filled 2015-06-04: qty 1

## 2015-06-04 MED ORDER — LACTATED RINGERS IR SOLN
Status: DC | PRN
  Administered 2015-06-03: 3000 mL

## 2015-06-04 MED ORDER — BUPIVACAINE HCL (PF) 0.25 % IJ SOLN
INTRAMUSCULAR | Status: DC | PRN
  Administered 2015-06-03: 3 mL

## 2015-06-04 MED ORDER — ALBUMIN HUMAN 5 % IV SOLN
INTRAVENOUS | Status: DC | PRN
  Administered 2015-06-03: via INTRAVENOUS

## 2015-06-04 MED ORDER — ROCURONIUM BROMIDE 100 MG/10ML IV SOLN
INTRAVENOUS | Status: AC
  Filled 2015-06-04: qty 1

## 2015-06-04 MED ORDER — PROMETHAZINE HCL 25 MG/ML IJ SOLN: 6.2500 mg | INTRAMUSCULAR | Status: DC | PRN

## 2015-06-04 MED ORDER — GLYCOPYRROLATE 0.2 MG/ML IJ SOLN
INTRAMUSCULAR | Status: DC | PRN
  Administered 2015-06-04: .4 mg via INTRAVENOUS

## 2015-06-04 MED ORDER — SCOPOLAMINE 1 MG/3DAYS TD PT72
MEDICATED_PATCH | TRANSDERMAL | Status: AC
  Filled 2015-06-04: qty 1

## 2015-06-04 MED ORDER — GLYCOPYRROLATE 0.2 MG/ML IJ SOLN
INTRAMUSCULAR | Status: AC
  Filled 2015-06-04: qty 2

## 2015-06-04 MED ORDER — PHENYLEPHRINE HCL 10 MG/ML IJ SOLN
INTRAMUSCULAR | Status: DC | PRN
  Administered 2015-06-03: 40 ug via INTRAVENOUS

## 2015-06-04 NOTE — Op Note (Signed)
[facility] PROCEDURE DATE: 06/03/2015  PREOPERATIVE DIAGNOSIS: Ruptured ectopic pregnancy POSTOPERATIVE DIAGNOSIS: Ruptured left fallopian tube ectopic pregnancy PROCEDURE: Laparoscopic left salpingectomy and removal of ectopic pregnancy SURGEON:  Adam Phenix, MD Assistant: Dr. Candelaria Celeste ANESTHESIOLOGIST: Jairo Ben, MD Anesthesiologist: Jairo Ben, MD CRNA: Doree Fudge Rhymer, CRNA  INDICATIONS: 25 y.o. G4P0 at [redacted]w[redacted]d here with the preoperative diagnoses as listed above.  Please refer to preoperative notes for more details. Patient was counseled regarding need for laparoscopic salpingectomy. Risks of surgery including bleeding which may require transfusion or reoperation, infection, injury to bowel or other surrounding organs, need for additional procedures including laparotomy and other postoperative/anesthesia complications were explained to patient.  Written informed consent was obtained.  FINDINGS:  Large amount of hemoperitoneum estimated to be about 1500 of blood and clots.  Dilated left fallopian tube containing ectopic gestation. Small normal appearing uterus,left fallopian tube with adhesions and mild swelling, right ovary and left ovary normal  ANESTHESIA: General INTRAVENOUS FLUIDS: 2500 ml, 4 units PRBC ESTIMATED BLOOD LOSS: 1500 ml hemoperitoneum URINE OUTPUT: 100 ml SPECIMENS: Left fallopian tube containing ectopic gestation COMPLICATIONS: None immediate  PROCEDURE IN DETAIL:  The patient was taken to the operating room where general anesthesia was administered and was found to be adequate.  She was placed in the dorsal lithotomy position, and was prepped and draped in a sterile manner.  A Foley catheter was inserted into her bladder and attached to constant drainage and a uterine manipulator was then advanced into the uterus .    After an adequate timeout was performed, attention was turned to the abdomen where an umbilical incision was made with the  scalpel. The abdomen was insufflated with the veress needle and the 5-mm trocar and sleeve were then advanced without difficulty with the laparoscope under direct visualization into the abdomen.  The abdomen was then insufflated with carbon dioxide gas and adequate pneumoperitoneum was obtained.  A survey of the patient's pelvis and abdomen revealed the findings above.  10 mm left lower quadrant port was then placed under direct visualization and 5 mm in RLQ.  The Nezhat suction irrigator was then used to suction the hemoperitoneum and irrigate the pelvis.  Attention was then turned to the left fallopian tube which was grasped and ligated from the underlying mesosalpinx and uterine attachment using the Harmonic instrument.  Good hemostasis was noted.  The specimen was placed in an EndoCatch bag and removed from the abdomen intact.  The abdomen was desufflated, and all instruments were removed.  The fascial incision of the 10-mm site was reapproximated with a 0 Vicryl figure-of-eight stitch; and all skin incisions were closed with 4-0 Vicryl and Dermabond. The patient tolerated the procedure well.  All instruments, needles, and sponge counts were correct x 2. The patient was taken to the recovery room in stable condition.   The patient will be discharged to home as per PACU criteria.  Routine postoperative instructions given.   Adam Phenix, MD 06/04/2015 12:39 AM

## 2015-06-04 NOTE — Addendum Note (Signed)
Addendum  created 06/04/15 1432 by Suella Grove, CRNA   Modules edited: Clinical Notes   Clinical Notes:  File: [id number]

## 2015-06-04 NOTE — Progress Notes (Signed)
Admission nutrition screen triggered for unintentional weight loss > 10 lbs within the last month.Chart reviewed. Unable to validate information as  there is no height or weight recorded for this pt for this admission. Last weight recorded on 02/13/15, 92 lbs.  If it is felt that this pt has experienced weight loss or has poor appetite once return to regular diet, order  Ensure BID   Elisabeth Cara M.Odis Luster LDN Neonatal Nutrition Support Specialist/RD III Pager [phone]      Phone [phone]

## 2015-06-04 NOTE — Anesthesia Postprocedure Evaluation (Signed)
Anesthesia Post Note  Patient: Marie Olson  Procedure(s) Performed: Procedure(s) (LRB): DIAGNOSTIC LAPAROSCOPY,  Left Salpingectomy with  REMOVAL OF ECTOPIC PREGNANCY (N/A)  Patient location during evaluation: PACU Anesthesia Type: General Level of consciousness: sedated, oriented and patient cooperative Pain management: pain level controlled Vital Signs Assessment: post-procedure vital signs reviewed and stable Respiratory status: spontaneous breathing, nonlabored ventilation, respiratory function stable and patient connected to nasal cannula oxygen Cardiovascular status: blood pressure returned to baseline and stable Postop Assessment: no signs of nausea or vomiting Anesthetic complications: no    Last Vitals:  Filed Vitals:   06/04/15 0239 06/04/15 0419  BP: 122/75 115/74  Pulse:  117  Temp: 37.2 C 37.3 C  Resp: 20 12    Last Pain:  Filed Vitals:   06/04/15 0420  PainSc: 0-No pain                 Hayze Gazda,E. Lemon Whitacre

## 2015-06-04 NOTE — Anesthesia Procedure Notes (Signed)
Date/Time: 06/03/2015 11:31 PM Performed by: Elbert Ewings Pre-anesthesia Checklist: Patient identified, Emergency Drugs available, Suction available, Patient being monitored and Timeout performed Patient Re-evaluated:Patient Re-evaluated prior to inductionOxygen Delivery Method: Circle system utilized Preoxygenation: Pre-oxygenation with 100% oxygen Intubation Type: Cricoid Pressure applied, Rapid sequence and IV induction Laryngoscope Size: Mac and 3 Grade View: Grade I Tube type: Oral Number of attempts: 1 Airway Equipment and Method: Stylet Secured at: 22 (22) cm Tube secured with: Tape Dental Injury: Teeth and Oropharynx as per pre-operative assessment

## 2015-06-04 NOTE — Discharge Summary (Signed)
OB Discharge Summary     Patient Name: Marie Olson DOB: 1991-01-13 MRN: [id number]  Date of admission: 06/03/2015 Delivering MD: This patient has no babies on file.  Date of discharge: 06/04/2015  Admitting diagnosis: 8w bleeding ruptured tubal pregnancy Intrauterine pregnancy: [redacted]w[redacted]d     Secondary diagnosis:  Active Problems:   Vaginal bleeding in pregnancy   Ectopic pregnancy, tubal   Acute blood loss anemia  Additional problems: none     Discharge diagnosis: Same, status post laparoscopy with salpingectomy                                                                                                Intraoperative procedures:blood transfusion  Hospital course:  The patient was seen in the emergency department for abdominal pain was found to have a ruptured ectopic pregnancy with large amount of hemoperitoneum. The patient was taken to the OR for exploratory laparoscopy salpingectomy due to a live ectopic. The procedure was uncomplicated. The patient did receive 2 units of packed red blood cells emergently released, followed by 2 more units postoperatively of matched red blood cells. Patient was observed overnight and released this morning with no further complications. She'll follow up in the clinic in 2 weeks. Patient was discussed return for fevers, chills, nausea, vomiting, increasing abdominal pain, weakness, fatigue.  Physical exam  Filed Vitals:   06/04/15 0215 06/04/15 0239 06/04/15 0419 06/04/15 0800  BP: 122/86 122/75 115/74 111/61  Pulse: 127  117 79  Temp: 99.3 F (37.4 C) 99 F (37.2 C) 99.2 F (37.3 C) 100.2 F (37.9 C)  TempSrc:   Axillary Oral  Resp: SpO2: 100% 100% 100% 100%   General: alert, cooperative and no distress Heart: regular rate, no murmur Lungs: clear to auscultation bilaterally, no wheezing.  Abdomen: Soft, nontender, nondistended.  Incision sites clean, dry, intact. DVT Evaluation: No evidence of DVT seen on physical  exam. Negative Homan's sign. Labs: Lab Results  Component Value Date   WBC 11.5* 06/04/2015   HGB 8.1* 06/04/2015   HCT 22.9* 06/04/2015   MCV 84.8 06/04/2015   PLT 122* 06/04/2015   CMP Latest Ref Rng 04/24/2015  Glucose 65 - 99 mg/dL 97  BUN 7 - 25 mg/dL 15  Creatinine 8.29 - 5.62 mg/dL 1.30  Sodium 865 - 784 mmol/L 132(L)  Potassium 3.5 - 5.3 mmol/L 3.5  Chloride 98 - 110 mmol/L 100  CO2 20 - 31 mmol/L 22  Calcium 8.6 - 10.2 mg/dL 9.7  Total Protein 6.1 - 8.1 g/dL 9.0(H)  Total Bilirubin 0.2 - 1.2 mg/dL 0.3  Alkaline Phos 33 - 115 U/L 61  AST 10 - 30 U/L 21  ALT 6 - 29 U/L 8    Discharge instruction: per After Visit Summary and "Baby and Me Booklet".  After visit meds:    Medication List    TAKE these medications        elvitegravir-cobicistat-emtricitabine-tenofovir 150-150-200-10 MG Tabs tablet  Commonly known as:  GENVOYA  Take 1 tablet by mouth daily with breakfast.     metoprolol succinate 25 MG  24 hr tablet  Commonly known as:  TOPROL-XL  Take 1 tablet (25 mg total) by mouth daily.     oxyCODONE-acetaminophen 5-325 MG tablet  Commonly known as:  PERCOCET/ROXICET  Take 1-2 tablets by mouth every 4 (four) hours as needed for severe pain (moderate to severe pain (when tolerating fluids)).        Diet: routine diet  Activity: Advance as tolerated. Pelvic rest for 6 weeks.   Outpatient follow up:2 weeks Follow up Appt:Future Appointments Date Time Provider Department Center  06/21/2015 10:00 AM Levie Heritage, DO [facility]   Follow up Visit:No Follow-up on file.  06/04/2015 Dorreen Valiente JEHIEL, DO

## 2015-06-04 NOTE — Discharge Instructions (Signed)
Ruptured Ectopic Pregnancy °An ectopic pregnancy is when the fertilized egg attaches (implants) outside the uterus. Most ectopic pregnancies occur in the fallopian tube. Rarely do ectopic pregnancies occur on the ovary, intestine, pelvis, or cervix. An ectopic pregnancy does not have the ability to develop into a normal, healthy baby.  °A ruptured ectopic pregnancy is one in which the fallopian tube gets torn or bursts and results in internal bleeding. Often there is intense abdominal pain, and sometimes, vaginal bleeding. Having an ectopic pregnancy can be a life-threatening experience. If left untreated, this dangerous condition can lead to a blood transfusion, abdominal surgery, or even death.  °CAUSES  °Damage to the fallopian tubes is the suspected cause in most ectopic pregnancies.  °RISK FACTORS °Depending on your circumstances, the amount of risk of having an ectopic pregnancy will vary. There are 3 categories that may help you identify whether you are potentially at risk. °High Risk °· You have gone through infertility treatment. °· You have had a previous ectopic pregnancy. °· You have had previous tubal surgery. °· You have had previous surgery to have the fallopian tubes tied (tubal ligation). °· You have tubal problems or diseases. °· You have been exposed to DES. DES is a medicine that was used until 1971 and had effects on babies whose mothers took the medicine. °· You become pregnant while using an intrauterine device (IUD) for birth control.  °Moderate Risk °· You have a history of infertility. °· You have a history of a sexually transmitted infection (STI). °· You have a history of pelvic inflammatory disease (PID). °· You have scarring from endometriosis. °· You have multiple sexual partners. °· You smoke.  °Low Risk °· You have had previous pelvic surgery. °· You use vaginal douching. °· You became sexually active before 27 years of age. °SYMPTOMS °An ectopic pregnancy should be suspected in  anyone who has missed a period and has abdominal pain or bleeding. °· You may experience normal pregnancy symptoms, such as: °¨ Nausea. °¨ Tiredness. °¨ Breast tenderness. °· Symptoms that are not normal include: °¨ Pain with intercourse. °¨ Irregular vaginal bleeding or spotting. °¨ Cramping or pain on one side, or in the lower abdomen. °¨ Fast heartbeat. °¨ Passing out while having a bowel movement. °· Symptoms of a ruptured ectopic pregnancy and internal bleeding may include: °¨ Sudden, severe pain in the abdomen and pelvis. °¨ Dizziness or fainting. °¨ Pain in the shoulder area. °DIAGNOSIS  °Tests that may be performed include: °· A pregnancy test. °· An ultrasound. °· Testing the specific level of pregnancy hormone in the bloodstream. °· Taking a sample of uterus tissue (dilation and curettage, D&C). °· Surgery to perform a visual exam of the inside of the abdomen using a lighted tube (laparoscopy). °TREATMENT  °Laparoscopic surgery or abdominal surgery is recommended for a ruptured ectopic pregnancy.  °· The whole fallopian tube may need to be removed (salpingectomy). °· If the tube is not too damaged, the tube may be saved, and the pregnancy will be surgically removed. In time, the tube may still function. °· If you have lost a lot of blood, you may need a blood transfusion. °· You may receive a Rho (D) immune globulin shot if you are Rh negative and the father is Rh positive, or if you do not know the Rh type of the father. This is to prevent problems with any future pregnancy. °SEEK IMMEDIATE MEDICAL CARE IF:  °You have any symptoms of an ectopic or ruptured ectopic pregnancy. This   is a medical emergency. °MAKE SURE YOU: °· Understand these instructions. °· Will watch your condition. °· Will get help right away if you are not doing well or get worse. °  °This information is not intended to replace advice given to you by your health care provider. Make sure you discuss any questions you have with your health  care provider. °  °Document Released: 04/17/2000 Document Revised: 04/25/2013 Document Reviewed: 01/30/2013 °Elsevier Interactive Patient Education ©2016 Else[employer] Inc. ° °

## 2015-06-04 NOTE — Transfer of Care (Signed)
Immediate Anesthesia Transfer of Care Note  Patient: Marie Olson  Procedure(s) Performed: Procedure(s): DIAGNOSTIC LAPAROSCOPY,  Left Salpingectomy with  REMOVAL OF ECTOPIC PREGNANCY (N/A)  Patient Location: PACU  Anesthesia Type:General  Level of Consciousness: awake, alert  and oriented  Airway & Oxygen Therapy: Patient Spontanous Breathing and Patient connected to nasal cannula oxygen  Post-op Assessment: Report given to RN and Post -op Vital signs reviewed and stable  Post vital signs: Reviewed and stable  Last Vitals:  Filed Vitals:   06/03/15 2132  BP: 113/75  Pulse: 137  Temp: 36.4 C  Resp: 16    Complications: No apparent anesthesia complications

## 2015-06-04 NOTE — Progress Notes (Signed)
We used  Interpreter this am with DR Adrian Blackwater  For am care and his d/c instruction  Number [phone]

## 2015-06-04 NOTE — Progress Notes (Signed)
Used  Interpreter no [id number] for d/c teaching this afternoon

## 2015-06-04 NOTE — Progress Notes (Signed)
Out in wheelchair with family

## 2015-06-04 NOTE — Anesthesia Postprocedure Evaluation (Signed)
Anesthesia Post Note  Patient: Marie Olson  Procedure(s) Performed: Procedure(s) (LRB): DIAGNOSTIC LAPAROSCOPY,  Left Salpingectomy with  REMOVAL OF ECTOPIC PREGNANCY (N/A)  Patient location during evaluation: [facility] Anesthesia Type: General Level of consciousness: awake, awake and alert, oriented and patient cooperative Pain management: pain level controlled Vital Signs Assessment: post-procedure vital signs reviewed and stable Respiratory status: spontaneous breathing, nonlabored ventilation and respiratory function stable Cardiovascular status: blood pressure returned to baseline and stable Postop Assessment: no headache, no backache, no signs of nausea or vomiting and adequate PO intake Anesthetic complications: no    Last Vitals:  Filed Vitals:   06/04/15 0800 06/04/15 1236  BP: 111/61 124/65  Pulse: 79 86  Temp: 37.9 C 37.2 C  Resp: 16 16    Last Pain:  Filed Vitals:   06/04/15 1237  PainSc: 0-No pain                 Kemani Demarais C

## 2015-06-05 ENCOUNTER — Encounter (HOSPITAL_COMMUNITY): Payer: Self-pay | Admitting: Obstetrics & Gynecology

## 2015-06-06 LAB — TYPE AND SCREEN
ABO/RH(D): A POS
Antibody Screen: NEGATIVE
Unit division: 0
Unit division: 0
Unit division: 0
Unit division: 0

## 2015-06-12 ENCOUNTER — Telehealth: Payer: Self-pay | Admitting: General Practice

## 2015-06-12 NOTE — Telephone Encounter (Signed)
Patient's son called and left message stating his mother just had surgery and is out of her medication and needs a refill sent to pharmacy and she also doesn't know what she can eat. Called number back and the man on the phone states he is on his way to the house and his mother speaks Swahili. States this is the only number. Called number back with swahili interpreter [phone] and talked to patient. Patient states she is out of pain medication and needs refill and also her stomach hurts when she eats something hot. Told patient that surgery would not affect her stomach this far out and she can eat whatever she likes. Also instructed her that we do not refill that pain medication this far from surgery but recommended OTC ibuprofen 600-800mg  every 6-8 hours. Patient verbalized understanding to all and had no questions

## 2015-06-13 ENCOUNTER — Encounter: Payer: Self-pay | Admitting: Infectious Disease

## 2015-06-21 ENCOUNTER — Encounter: Payer: Self-pay | Admitting: Family Medicine

## 2015-06-21 ENCOUNTER — Ambulatory Visit: Payer: Self-pay | Admitting: Family Medicine

## 2015-06-21 VITALS — BP 143/78 | HR 101 | Temp 98.7°F | Wt 89.5 lb

## 2015-06-21 DIAGNOSIS — Z09 Encounter for follow-up examination after completed treatment for conditions other than malignant neoplasm: Secondary | ICD-10-CM

## 2015-06-21 MED ORDER — NORETHIN ACE-ETH ESTRAD-FE 1-20 MG-MCG PO TABS: 1.0000 | ORAL_TABLET | Freq: Every day | ORAL | Status: DC

## 2015-06-21 NOTE — Addendum Note (Signed)
Addended by: Sherre Lain A on: 06/21/2015 11:17 AM   Modules accepted: Orders

## 2015-06-21 NOTE — Progress Notes (Signed)
   Subjective:    Patient ID: Marie Olson, female    DOB: [birth year], 25 y.o.   MRN: [id number]  HPI Had follow up appt after laparoscopic salpingectomy for ectopic pregnancy.  She reports no pain, no wound complications, no vaginal bleeding.   Review of Systems     Objective:   Physical Exam  Constitutional: She appears well-developed and well-nourished.  HENT:  Head: Normocephalic and atraumatic.  Abdominal: Soft. She exhibits no mass. There is no tenderness. There is no rebound and no guarding.  Skin: Skin is warm and dry. No rash noted. No erythema. No pallor.  Psychiatric: She has a normal mood and affect. Her behavior is normal. Judgment and thought content normal.      Assessment & Plan:  1. Postoperative follow-up Discussed not getting pregnant for next 3-6 months.  OCP prescribed.

## 2015-07-08 ENCOUNTER — Telehealth: Payer: Self-pay | Admitting: *Deleted

## 2015-07-08 NOTE — Telephone Encounter (Signed)
[facility]icaid [facility] completed and faxed

## 2015-07-09 NOTE — Telephone Encounter (Signed)
Very good 

## 2015-07-15 ENCOUNTER — Encounter: Payer: Self-pay | Admitting: *Deleted

## 2015-07-15 NOTE — Telephone Encounter (Addendum)
Pt does not have Medicaid coverage at this time per [place] [employer].  Pt had an ectopic pregenacy that was surgically terminated.  Pt needs to come to [facility] for RW/ADAP coverage.  Pt is non-English speaking.  Will send a letter in Swahili to the husband/patient to come to office early to prepare appropriate [employer] and RW/ADAP.

## 2015-07-15 NOTE — Telephone Encounter (Signed)
Excellent

## 2015-07-31 ENCOUNTER — Ambulatory Visit: Payer: Self-pay

## 2015-07-31 ENCOUNTER — Ambulatory Visit: Payer: Self-pay | Admitting: Infectious Disease

## 2015-08-02 ENCOUNTER — Other Ambulatory Visit: Payer: Self-pay

## 2015-08-02 DIAGNOSIS — B20 Human immunodeficiency virus [HIV] disease: Secondary | ICD-10-CM

## 2015-08-02 LAB — CBC WITH DIFFERENTIAL/PLATELET
Basophils Absolute: 0.1 10*3/uL (ref 0.0–0.1)
Basophils Relative: 1 % (ref 0–1)
EOS ABS: 0.8 10*3/uL — AB (ref 0.0–0.7)
EOS PCT: 15 % — AB (ref 0–5)
HEMATOCRIT: 40.6 % (ref 36.0–46.0)
Hemoglobin: 13.2 g/dL (ref 12.0–15.0)
LYMPHS PCT: 38 % (ref 12–46)
Lymphs Abs: 2 10*3/uL (ref 0.7–4.0)
MCH: 28.3 pg (ref 26.0–34.0)
MCHC: 32.5 g/dL (ref 30.0–36.0)
MCV: 86.9 fL (ref 78.0–100.0)
MONO ABS: 0.6 10*3/uL (ref 0.1–1.0)
MPV: 10 fL (ref 8.6–12.4)
Monocytes Relative: 12 % (ref 3–12)
Neutro Abs: 1.8 10*3/uL (ref 1.7–7.7)
Neutrophils Relative %: 34 % — ABNORMAL LOW (ref 43–77)
PLATELETS: 212 10*3/uL (ref 150–400)
RBC: 4.67 MIL/uL (ref 3.87–5.11)
RDW: 14.1 % (ref 11.5–15.5)
WBC: 5.2 10*3/uL (ref 4.0–10.5)

## 2015-08-02 LAB — COMPLETE METABOLIC PANEL WITH GFR
ALT: 10 U/L (ref 6–29)
AST: 26 U/L (ref 10–30)
Albumin: 4 g/dL (ref 3.6–5.1)
Alkaline Phosphatase: 70 U/L (ref 33–115)
BUN: 10 mg/dL (ref 7–25)
CALCIUM: 8.8 mg/dL (ref 8.6–10.2)
CHLORIDE: 102 mmol/L (ref 98–110)
CO2: 24 mmol/L (ref 20–31)
Creat: 0.68 mg/dL (ref 0.50–1.10)
GFR, Est Non African American: >89 mL/min (ref >=60)
Glucose, Bld: 86 mg/dL (ref 65–99)
POTASSIUM: 3.9 mmol/L (ref 3.5–5.3)
Sodium: 137 mmol/L (ref 135–146)
Total Bilirubin: 0.2 mg/dL (ref 0.2–1.2)
Total Protein: 8.8 g/dL — ABNORMAL HIGH (ref 6.1–8.1)

## 2015-08-02 LAB — T-HELPER CELL (CD4) - (RCID CLINIC ONLY)
CD4 T CELL ABS: 330 /uL — AB (ref 400–2700)
CD4 T CELL HELPER: 15 % — AB (ref 33–55)

## 2015-08-05 LAB — HIV-1 RNA ULTRAQUANT REFLEX TO GENTYP+
HIV 1 RNA QUANT: 39281 {copies}/mL — AB (ref <20)
HIV-1 RNA QUANT, LOG: 4.59 {Log_copies}/mL — AB (ref <1.30)

## 2015-08-12 LAB — HIV-1 GENOTYPR PLUS

## 2015-08-14 ENCOUNTER — Telehealth: Payer: Self-pay

## 2015-08-14 NOTE — Telephone Encounter (Signed)
[name] with [facility] is calling to inform Dr Daiva EvesVan Dam that [name]'s husband had a positive RPR with a 1:1 titer and no previous history of syphilis.  The Health Department would like us to screen patient at May visit .   I will add the lab for next visit.     Laurell Josephsammy K Terrance Usery, RN

## 2015-09-04 ENCOUNTER — Ambulatory Visit: Payer: Medicaid Other | Admitting: Infectious Disease

## 2015-09-05 ENCOUNTER — Encounter: Payer: Self-pay | Admitting: Infectious Disease

## 2015-09-13 ENCOUNTER — Telehealth: Payer: Self-pay

## 2015-09-13 NOTE — Telephone Encounter (Signed)
Patient has [facility] and has told Malachi BondsIda, Child psychotherapistocial Worker she is in care.  They suspected this may not be true and has concerns related to this patient.  Her HIV positive child has not been receiving the ART as she should and her CD-4 counts are low.  There is some questions related to the cognition of the parent.  The child has a local  medicaid assigned Case Manager who makes arrangement for [facility]. Patient has been recently having some Gyn issues and [facility].  She has not seen Dr Daiva EvesVan Dam this year.  We have attempted to get her into the clinic for STD testing and f/u visits.  Since she no longer has medicaid she will need [employer] and ADAP.  She will also need [facility] even though she is not [place] speaking.   [facility] has an interpreter they use for the child and the Mother prefers to use her though the Language resources  and she is very familiar with the family.  Alona BeneJoyce- Language Resources  [phone].  Patient's medicaid has lapsed and Malachi Bondsda feels she still qualifies but will need help with re- applying for services.  I will speak with Rob HickmanMitch, Bridge Counselor to see if we are able to assist this patient .  I have scheduled visit for patient with the hopes we will be able to get her here for the visit.  Malachi BondsIda, Social Worker [facility]   [phone] or [phone]  Laurell Josephsammy K Raizel Wesolowski, RN

## 2015-10-03 ENCOUNTER — Encounter: Payer: Self-pay | Admitting: Infectious Disease

## 2015-10-03 ENCOUNTER — Ambulatory Visit (INDEPENDENT_AMBULATORY_CARE_PROVIDER_SITE_OTHER): Payer: Medicaid Other | Admitting: Infectious Disease

## 2015-10-03 VITALS — BP 136/83 | HR 128 | Temp 98.0°F | Wt 91.0 lb

## 2015-10-03 DIAGNOSIS — Z9119 Patient's noncompliance with other medical treatment and regimen: Secondary | ICD-10-CM | POA: Diagnosis not present

## 2015-10-03 DIAGNOSIS — B20 Human immunodeficiency virus [HIV] disease: Secondary | ICD-10-CM

## 2015-10-03 DIAGNOSIS — Z603 Acculturation difficulty: Secondary | ICD-10-CM

## 2015-10-03 DIAGNOSIS — B181 Chronic viral hepatitis B without delta-agent: Secondary | ICD-10-CM

## 2015-10-03 DIAGNOSIS — Z113 Encounter for screening for infections with a predominantly sexual mode of transmission: Secondary | ICD-10-CM

## 2015-10-03 DIAGNOSIS — Z91199 Patient's noncompliance with other medical treatment and regimen due to unspecified reason: Secondary | ICD-10-CM

## 2015-10-03 LAB — COMPLETE METABOLIC PANEL WITH GFR
ALT: 11 U/L (ref 6–29)
AST: 28 U/L (ref 10–30)
Albumin: 4.3 g/dL (ref 3.6–5.1)
Alkaline Phosphatase: 68 U/L (ref 33–115)
BUN: 9 mg/dL (ref 7–25)
CHLORIDE: 99 mmol/L (ref 98–110)
CO2: 22 mmol/L (ref 20–31)
Calcium: 9 mg/dL (ref 8.6–10.2)
Creat: 0.64 mg/dL (ref 0.50–1.10)
Glucose, Bld: 97 mg/dL (ref 65–99)
POTASSIUM: 3.1 mmol/L — AB (ref 3.5–5.3)
Sodium: 132 mmol/L — ABNORMAL LOW (ref 135–146)
Total Bilirubin: 0.5 mg/dL (ref 0.2–1.2)
Total Protein: 9.3 g/dL — ABNORMAL HIGH (ref 6.1–8.1)

## 2015-10-03 LAB — CBC WITH DIFFERENTIAL/PLATELET
BASOS ABS: 0 {cells}/uL (ref 0–200)
Basophils Relative: 0 %
EOS PCT: 7 %
Eosinophils Absolute: 350 cells/uL (ref 15–500)
HCT: 37.2 % (ref 35.0–45.0)
Hemoglobin: 12.7 g/dL (ref 11.7–15.5)
LYMPHS PCT: 21 %
Lymphs Abs: 1050 cells/uL (ref 850–3900)
MCH: 28.3 pg (ref 27.0–33.0)
MCHC: 34.1 g/dL (ref 32.0–36.0)
MCV: 83 fL (ref 80.0–100.0)
MONOS PCT: 9 %
MPV: 10.4 fL (ref 7.5–12.5)
Monocytes Absolute: 450 cells/uL (ref 200–950)
NEUTROS ABS: 3150 {cells}/uL (ref 1500–7800)
Neutrophils Relative %: 63 %
PLATELETS: 228 10*3/uL (ref 140–400)
RBC: 4.48 MIL/uL (ref 3.80–5.10)
RDW: 15.6 % — AB (ref 11.0–15.0)
WBC: 5 10*3/uL (ref 3.8–10.8)

## 2015-10-03 LAB — LIPID PANEL
CHOL/HDL RATIO: 3.1 ratio (ref <=5.0)
CHOLESTEROL: 181 mg/dL (ref 125–200)
HDL: 59 mg/dL (ref >=46)
LDL Cholesterol: 111 mg/dL (ref <130)
TRIGLYCERIDES: 53 mg/dL (ref <150)
VLDL: 11 mg/dL (ref <30)

## 2015-10-03 MED ORDER — DARUNAVIR-COBICISTAT 800-150 MG PO TABS: 1.0000 | ORAL_TABLET | Freq: Every day | ORAL | Status: DC

## 2015-10-03 MED ORDER — DARUNAVIR-COBICISTAT 800-150 MG PO TABS
1.0000 | ORAL_TABLET | Freq: Every day | ORAL | Status: DC
Start: 2015-10-03 — End: 2015-10-03

## 2015-10-03 MED ORDER — NORGESTIM-ETH ESTRAD TRIPHASIC 0.18/0.215/0.25 MG-35 MCG PO TABS: 1.0000 | ORAL_TABLET | Freq: Every day | ORAL | Status: DC

## 2015-10-03 MED ORDER — EMTRICITABINE-TENOFOVIR AF 200-25 MG PO TABS: 1.0000 | ORAL_TABLET | Freq: Every day | ORAL | Status: DC

## 2015-10-03 NOTE — Progress Notes (Signed)
Chief complaint: Chief complaint: Here for follow-up for HIV not on medications Subjective:    Patient ID: Marie Olson, female    DOB: 12/10/1990, 25 y.o.   MRN: [id number]  HPI   25 year old Swahili speaking African lady with newly diagnosed HIV, here with HIV + husband . She had fairly high viral load and CD4 just above 200 when we first checked it.  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  Both she and her husband had missed multiple appointments with me it seems due to a language barrier and they are not understanding when their appointments were.   She claimed previously to be  highly adherent to her GENVOYA and has not missed any doses since she started on the medicines in April. Of 2016. However she had become quite viremic when last checked in March.  Of note she had believed that she had lost Medicaid. We then had her apply for ADAP that her ADAP application was rejected due to her being "Medicaid eligible. For investigation by our case managers and Mitch with bridge counseling the patient apparently still is Medicaid active and we are sending her antiretrovirals to the nearest pharmacy. Marthann SchillerMitch will pick them up and bring them to the patient we will change her [facility] to [facility] DES[facility]OVY given the chaos recently with regards to her adherence to medications and being up to obtain her medications. Her husband who is also my patient is actually doing quite well on STRIBILD with undetectable viral load he has insurance however has not had a gap in coverage.  They also have a child who is a six-year-old boy who also has HIV which is not well controlled and is propped up phone call somewhat [facility] with regards to the boys health and whether or not mom is giving the medicines to him mom and dad claim of their giving medications to the son reliably. I worry  however that this may not be being done and there may be a communication and language barrier and a misunderstanding about how the meds are to be administered that may be impairing him receiving proper medications.  Lab Results  Component Value Date   HIV1RNAQUANT 39281* 08/02/2015   HIV1RNAQUANT <20 04/24/2015   HIV1RNAQUANT 1461* 02/13/2015     Lab Results  Component Value Date   CD4TABS 330* 08/02/2015   CD4TABS 350* 04/24/2015   CD4TABS 370* 12/24/2014   She has complaints of some pain in her teeth and wants to see the dentist.  Past Medical History  Diagnosis Date  . HIV (human immunodeficiency virus infection) (HCC)   . Abscess   . Hypertension   . 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 Housing problems 12/24/2014    Past Surgical History  Procedure Laterality Date  . Diagnostic laparoscopy with removal of ectopic pregnancy N/A 06/03/2015    Procedure: DIAGNOSTIC LAPAROSCOPY,  Left Salpingectomy with  REMOVAL OF ECTOPIC PREGNANCY;  Surgeon: Adam PhenixJames G Arnold, MD;  Location: [facility];  Service: Gynecology;  Laterality: N/A;    Family History  Problem Relation Age of Onset  . Family history unknown: Yes      Social History   Social History  . Marital Status: Married    Spouse Name: N/A  . Number of Children: N/A  . Years of Education: N/A   Social History  Main Topics  . Smoking status: Never Smoker   . Smokeless tobacco: None  . Alcohol Use: No  . Drug Use: None  . Sexual Activity:    Partners: Male    Pharmacist, hospital Protection: None   Other Topics Concern  . None   Social History Narrative    No Known Allergies   Current outpatient prescriptions:  .  darunavir-cobicistat (PREZCOBIX) 800-150 MG tablet, Take 1 tablet by mouth daily., Disp: 30 tablet, Rfl: 11 .  emtricitabine-tenofovir AF (DESCOVY) 200-25 MG tablet, Take 1 tablet by mouth daily., Disp: 30 tablet, Rfl: 11 .  metoprolol succinate (TOPROL-XL) 25 MG 24  hr tablet, Take 1 tablet (25 mg total) by mouth daily. (Patient not taking: Reported on 10/03/2015), Disp: 180 tablet, Rfl: 2 .  Norgestimate-Ethinyl Estradiol Triphasic 0.18/0.215/0.25 MG-35 MCG tablet, Take 1 tablet by mouth daily., Disp: 1 Package, Rfl: 11   Review of Systems  Constitutional: Negative for fever, chills, diaphoresis, activity change, appetite change, fatigue and unexpected weight change.  HENT: Negative for congestion, rhinorrhea, sinus pressure, sneezing, sore throat and trouble swallowing.   Eyes: Negative for photophobia and visual disturbance.  Respiratory: Negative for cough, chest tightness, shortness of breath, wheezing and stridor.   Cardiovascular: Negative for chest pain, palpitations and leg swelling.  Gastrointestinal: Negative for nausea, vomiting, abdominal pain, diarrhea, constipation, blood in stool, abdominal distention and anal bleeding.  Genitourinary: Negative for dysuria, hematuria, flank pain and difficulty urinating.  Musculoskeletal: Negative for myalgias, back pain, joint swelling, arthralgias and gait problem.  Skin: Negative for color change, pallor, rash and wound.  Neurological: Negative for tremors, weakness and light-headedness.  Hematological: Negative for adenopathy. Does not bruise/bleed easily.  Psychiatric/Behavioral: Negative for behavioral problems, confusion, sleep disturbance, dysphoric mood, decreased concentration and agitation.       Objective:   Physical Exam  Constitutional: She is oriented to person, place, and time. She appears well-developed and well-nourished. No distress.  HENT:  Head: Normocephalic and atraumatic.  Mouth/Throat: Uvula is midline and oropharynx is clear and moist. No oropharyngeal exudate.  Eyes: Conjunctivae and EOM are normal. No scleral icterus.  Neck: Normal range of motion. Neck supple.  Cardiovascular: Normal rate and regular rhythm.   Pulmonary/Chest: Effort normal. No respiratory distress. She has  no wheezes.  Abdominal: She exhibits no distension.  Musculoskeletal: She exhibits no edema.  Neurological: She is alert and oriented to person, place, and time. She exhibits normal muscle tone. Coordination normal.  Skin: Skin is warm and dry. No rash noted. She is not diaphoretic. No erythema. No pallor.  Psychiatric: She has a normal mood and affect. Her behavior is normal. Judgment and thought content normal.          Assessment & Plan:   HIV disease Change to Prezcobix and Descovy. She also may need PCP prophylaxis  Chronic hepatitis B without delta agent and without hepatic coma: To be on covered with TAF/FTC   Need for birth control: OC to be rx that fit with her ARV  Son with HIV: urged her and husband to make an appt for their son and go with Him and with a Investment banker, corporate and with the medications to their appointment so he can be clarified how they're to give the son the medications and if a problem can be identified.  Do think language is a significant problem for the patient herself and is impaired her ability to receive care and likely is playing a role in the son's ability to  receive care.  We  spent greater than 40  minutes with the patient including greater than 50% of time in face to face counsel of the patient with Swahili interpreter re her HIV, her new  ARV regimen, her desire for oral contraceptiion and re her son's health and his HIV infection which is not controlled. ,and in coordination of her  care.

## 2015-10-03 NOTE — Patient Instructions (Signed)
Make an appointment with Minh in 2 weeks then with Dr. Daiva EvesVan Dam in a month

## 2015-10-03 NOTE — Addendum Note (Signed)
Addended by: Mariea ClontsGREEN, Jaquelinne Glendening D on: 10/03/2015 03:24 PM   Modules accepted: Orders

## 2015-10-03 NOTE — Addendum Note (Signed)
Addended by: Lurlean LeydenPOOLE, Aerie Donica F on: 10/03/2015 03:03 PM   Modules accepted: Orders

## 2015-10-04 LAB — RPR

## 2015-10-04 LAB — T-HELPER CELL (CD4) - (RCID CLINIC ONLY)
CD4 T CELL ABS: 250 /uL — AB (ref 400–2700)
CD4 T CELL HELPER: 22 % — AB (ref 33–55)

## 2015-10-04 LAB — URINE CYTOLOGY ANCILLARY ONLY
Chlamydia: NEGATIVE
Neisseria Gonorrhea: NEGATIVE

## 2015-10-07 LAB — HIV RNA, RTPCR W/R GT (RTI, PI,INT)
HIV 1 RNA QUANT: 23700 {copies}/mL — AB
HIV-1 RNA QUANT, LOG: 4.37 {Log_copies}/mL — AB

## 2015-10-12 LAB — RFLX HIV-1 INTEGRASE GENOTYPE

## 2015-10-12 LAB — HIV-1 GENOTYPE: HIV-1 Genotype: DETECTED — AB

## 2015-10-17 ENCOUNTER — Ambulatory Visit (INDEPENDENT_AMBULATORY_CARE_PROVIDER_SITE_OTHER): Payer: Medicaid Other | Admitting: Pharmacist Clinician (PhC)/ Clinical Pharmacy Specialist

## 2015-10-17 DIAGNOSIS — B20 Human immunodeficiency virus [HIV] disease: Secondary | ICD-10-CM

## 2015-10-17 MED ORDER — ONDANSETRON HCL 4 MG PO TABS: 4.0000 mg | ORAL_TABLET | Freq: Three times a day (TID) | ORAL | Status: DC | PRN

## 2015-10-17 NOTE — Progress Notes (Signed)
Patient ID: Marie Olson, female   DOB: April 05, 1991, 25 y.o.   MRN: [id number] HPI: Marie Olson is a 25 y.o. female who is here for her HIV f/u visit.   Allergies: No Known Allergies  Vitals:    Past Medical History: Past Medical History  Diagnosis Date  . HIV (human immunodeficiency virus infection) ([place])   . Abscess   . Hypertension   . 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 Housing problems 12/24/2014    Social History: Social History   Social History  . Marital Status: Married    Spouse Name: N/A  . Number of Children: N/A  . Years of Education: N/A   Social History Main Topics  . Smoking status: Never Smoker   . Smokeless tobacco: Not on file  . Alcohol Use: No  . Drug Use: Not on file  . Sexual Activity:    Partners: Male    Patent examiner Protection: None   Other Topics Concern  . Not on file   Social History Narrative    Previous Regimen: Stribild  Current Regimen: Prezcobix/Descovy  Labs: HIV 1 RNA QUANT (copies/mL)  Date Value  10/03/2015 23700*  08/02/2015 39281*  04/24/2015 <20   CD4 T CELL ABS (/uL)  Date Value  10/03/2015 250*  08/02/2015 330*  04/24/2015 350*   HEP B S AB (no units)  Date Value  08/17/2014 NEG   HEPATITIS B SURFACE AG (no units)  Date Value  08/17/2014 POSITIVE*   HCV AB (no units)  Date Value  08/17/2014 NEGATIVE    CrCl: CrCl cannot be calculated (Unknown ideal weight.).  Lipids:    Component Value Date/Time   CHOL 181 10/03/2015 1152   TRIG 53 10/03/2015 1152   HDL 59 10/03/2015 1152   CHOLHDL 3.1 10/03/2015 1152   VLDL 11 10/03/2015 1152   LDLCALC 111 10/03/2015 1152    Assessment: Marie Olson is here with her interpreter for the HIV visit. She has a hx of compliance issue in recently due to the fact that her husband and one of the kids is infected. She has been taking the meds before her meals. She stated that she gets nausea right after it. I told to  take with a meal instead of before meal. She has been missing visits due to duplicated infectious disease appt for her daughter at [facility]. She has an appt with Dr. Tommy Medal in early July. We called [facility] to confirm if there is a duplication in appt and there is not. She has been having some confusion about where to pick up her meds. Rx were sent to [facility] on [address]. Showed her where it is and how to pick meds. She is going to use the person that has been helping her with rides to pick up meds.   We are going to give her zofran to help with the nausea. Told to take 1 tablet daily for the first 3 days then use it as PRN. The interpreter asked her to repeat the instructions and she was able to do it at the end of the visit. She is going to the pharmacy to pick up the zofran and the oral contraceptives today.  Apparently, she stated that she needs another shot of one vaccines for [employer] process but they stated that they couldn't give it due to her HIV status. MMR? We are going to have to confirm with health department.   Recommendations:  Cont Prez/Descovy daily for HIV  Zofran 24m TID PRN for nausea Make sure to take meds with meal F/u with Dr. VTommy Medalon 7/6   PWilfred Lacy PharmD [facility] 10/17/2015, 1:16 PM

## 2015-11-07 ENCOUNTER — Ambulatory Visit: Payer: Medicaid Other | Admitting: *Deleted

## 2015-11-07 ENCOUNTER — Encounter: Payer: Self-pay | Admitting: Infectious Disease

## 2015-11-07 ENCOUNTER — Ambulatory Visit (INDEPENDENT_AMBULATORY_CARE_PROVIDER_SITE_OTHER): Payer: Self-pay | Admitting: Infectious Disease

## 2015-11-07 VITALS — BP 138/82 | HR 92 | Temp 98.2°F | Wt 93.0 lb

## 2015-11-07 DIAGNOSIS — Z91199 Patient's noncompliance with other medical treatment and regimen due to unspecified reason: Secondary | ICD-10-CM

## 2015-11-07 DIAGNOSIS — Z113 Encounter for screening for infections with a predominantly sexual mode of transmission: Secondary | ICD-10-CM

## 2015-11-07 DIAGNOSIS — Z9119 Patient's noncompliance with other medical treatment and regimen: Secondary | ICD-10-CM

## 2015-11-07 DIAGNOSIS — B20 Human immunodeficiency virus [HIV] disease: Secondary | ICD-10-CM

## 2015-11-07 DIAGNOSIS — O4690 Antepartum hemorrhage, unspecified, unspecified trimester: Secondary | ICD-10-CM

## 2015-11-07 DIAGNOSIS — Z603 Acculturation difficulty: Secondary | ICD-10-CM

## 2015-11-07 LAB — COMPLETE METABOLIC PANEL WITH GFR
ALT: 11 U/L (ref 6–29)
AST: 30 U/L (ref 10–30)
Albumin: 4.1 g/dL (ref 3.6–5.1)
Alkaline Phosphatase: 63 U/L (ref 33–115)
BILIRUBIN TOTAL: 0.3 mg/dL (ref 0.2–1.2)
BUN: 13 mg/dL (ref 7–25)
CHLORIDE: 103 mmol/L (ref 98–110)
CO2: 25 mmol/L (ref 20–31)
CREATININE: 0.71 mg/dL (ref 0.50–1.10)
Calcium: 9.1 mg/dL (ref 8.6–10.2)
GFR, Est African American: >89 mL/min (ref >=60)
GFR, Est Non African American: >89 mL/min (ref >=60)
GLUCOSE: 101 mg/dL — AB (ref 65–99)
Potassium: 3.6 mmol/L (ref 3.5–5.3)
SODIUM: 137 mmol/L (ref 135–146)
TOTAL PROTEIN: 9 g/dL — AB (ref 6.1–8.1)

## 2015-11-07 LAB — CBC WITH DIFFERENTIAL/PLATELET
BASOS ABS: 46 {cells}/uL (ref 0–200)
BASOS PCT: 1 %
EOS ABS: 920 {cells}/uL — AB (ref 15–500)
Eosinophils Relative: 20 %
HEMATOCRIT: 35.2 % (ref 35.0–45.0)
Hemoglobin: 11.9 g/dL (ref 11.7–15.5)
LYMPHS PCT: 35 %
Lymphs Abs: 1610 cells/uL (ref 850–3900)
MCH: 29.6 pg (ref 27.0–33.0)
MCHC: 33.8 g/dL (ref 32.0–36.0)
MCV: 87.6 fL (ref 80.0–100.0)
MONO ABS: 414 {cells}/uL (ref 200–950)
MONOS PCT: 9 %
MPV: 9.7 fL (ref 7.5–12.5)
Neutro Abs: 1610 cells/uL (ref 1500–7800)
Neutrophils Relative %: 35 %
PLATELETS: 252 10*3/uL (ref 140–400)
RBC: 4.02 MIL/uL (ref 3.80–5.10)
RDW: 14.7 % (ref 11.0–15.0)
WBC: 4.6 10*3/uL (ref 3.8–10.8)

## 2015-11-07 MED ORDER — ENSURE PO LIQD: 237.0000 mL | Freq: Two times a day (BID) | ORAL | Status: DC

## 2015-11-07 MED ORDER — ONDANSETRON HCL 4 MG PO TABS: 4.0000 mg | ORAL_TABLET | Freq: Three times a day (TID) | ORAL | Status: DC | PRN

## 2015-11-07 MED ORDER — NORGESTIM-ETH ESTRAD TRIPHASIC 0.18/0.215/0.25 MG-35 MCG PO TABS: 1.0000 | ORAL_TABLET | Freq: Every day | ORAL | Status: DC

## 2015-11-07 NOTE — Progress Notes (Signed)
Chief complaint: Chief complaint: Here for follow-up for HIV  On ARV with nausea and poor appetite  Subjective:    Patient ID: Marie Olson, female    DOB: 07/21/1990, 25 y.o.   MRN: [id number]  HPI   25 year old Swahili speaking African lady with newly diagnosed HIV, here with HIV + husband . She had fairly high viral load and CD4 just above 200 when we first checked it.  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   She has had very poor compliance and her child's HIV has also NOT been controlled.  We tried Genvoya for her but due to her terrible compliance changed her over to PI based therapy.  She is having nausea and poor appetite but claims to be taking her meds.      Lab Results  Component Value Date   HIV1RNAQUANT 23700* 10/03/2015   HIV1RNAQUANT 39281* 08/02/2015   HIV1RNAQUANT <20 04/24/2015     Lab Results  Component Value Date   CD4TABS 250* 10/03/2015   CD4TABS 330* 08/02/2015   CD4TABS 350* 04/24/2015   She has complaints of  pain in her teeth AGAIN and wants to see the dentist. I again told her that she could greatly improve her chances of being taken care of if she was complaint with ARV therapy as some of our ambulatory dentists do not readily do interventions on our patients who are not well controlled.  Past Medical History  Diagnosis Date  . HIV (human immunodeficiency virus infection) ([facility])   . Abscess   . Hypertension   . 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 Housing problems 12/24/2014    Past Surgical History  Procedure Laterality Date  . Diagnostic laparoscopy with removal of ectopic pregnancy N/A 06/03/2015    Procedure: DIAGNOSTIC LAPAROSCOPY,  Left Salpingectomy with  REMOVAL OF ECTOPIC PREGNANCY;  Surgeon: Adam PhenixJames G Arnold, MD;  Location: [facility];  Service: Gynecology;  Laterality: N/A;    Family History  Problem Relation Age of Onset  . Family history unknown: Yes       Social History   Social History  . Marital Status: Married    Spouse Name: N/A  . Number of Children: N/A  . Years of Education: N/A   Social History Main Topics  . Smoking status: Never Smoker   . Smokeless tobacco: None  . Alcohol Use: No  . Drug Use: None  . Sexual Activity:    Partners: Male    Pharmacist, hospitalBirth Control/ Protection: None   Other Topics Concern  . None   Social History Narrative    No Known Allergies   Current outpatient prescriptions:  .  darunavir-cobicistat (PREZCOBIX) 800-150 MG tablet, Take 1 tablet by mouth daily., Disp: 30 tablet, Rfl: 11 .  emtricitabine-tenofovir AF (DESCOVY) 200-25 MG tablet, Take 1 tablet by mouth daily., Disp: 30 tablet, Rfl: 11 .  Norgestimate-Ethinyl Estradiol Triphasic 0.18/0.215/0.25 MG-35 MCG tablet, Take 1 tablet by mouth daily., Disp: 1 Package, Rfl: 11 .  ondansetron (ZOFRAN) 4 MG tablet, Take 1 tablet (4 mg total) by mouth every 8 (eight) hours as needed for nausea or vomiting., Disp: 30 tablet, Rfl: 2 .  ENSURE (ENSURE), Take 237 mLs by mouth 2 (two) times daily between meals., Disp: 237 mL, Rfl: 4 .  metoprolol succinate (TOPROL-XL) 25 MG 24 hr tablet, Take 1 tablet (25 mg total) by mouth daily. (Patient not taking: Reported on 10/03/2015), Disp:  180 tablet, Rfl: 2   Review of Systems  Constitutional: Positive for appetite change. Negative for fever, chills, diaphoresis, activity change, fatigue and unexpected weight change.  HENT: Positive for dental problem. Negative for congestion, rhinorrhea, sinus pressure, sneezing, sore throat and trouble swallowing.   Eyes: Negative for photophobia and visual disturbance.  Respiratory: Negative for cough, chest tightness, shortness of breath, wheezing and stridor.   Cardiovascular: Negative for chest pain, palpitations and leg swelling.  Gastrointestinal: Positive for nausea. Negative for vomiting, abdominal pain, diarrhea, constipation, blood in stool, abdominal distention and  anal bleeding.  Genitourinary: Negative for dysuria, hematuria, flank pain and difficulty urinating.  Musculoskeletal: Negative for myalgias, back pain, joint swelling, arthralgias and gait problem.  Skin: Negative for color change, pallor, rash and wound.  Neurological: Negative for tremors, weakness and light-headedness.  Hematological: Negative for adenopathy. Does not bruise/bleed easily.  Psychiatric/Behavioral: Negative for behavioral problems, confusion, sleep disturbance, dysphoric mood, decreased concentration and agitation.       Objective:   Physical Exam  Constitutional: She is oriented to person, place, and time. She appears well-nourished. No distress.  HENT:  Head: Normocephalic and atraumatic.  Mouth/Throat: Uvula is midline and oropharynx is clear and moist. No oropharyngeal exudate.    Eyes: Conjunctivae and EOM are normal. No scleral icterus.  Neck: Normal range of motion. Neck supple.  Cardiovascular: Normal rate and regular rhythm.   Pulmonary/Chest: Effort normal. No respiratory distress. She has no wheezes.  Abdominal: She exhibits no distension.  Musculoskeletal: She exhibits no edema.  Neurological: She is alert and oriented to person, place, and time. She exhibits normal muscle tone. Coordination normal.  Skin: Skin is warm and dry. No rash noted. She is not diaphoretic. No erythema. No pallor.  Psychiatric: She has a normal mood and affect. Her behavior is normal. Judgment and thought content normal.          Assessment & Plan:   HIV: continue Prezcobix and Descovy and re-enroll into [facility], check labs today  Chronic hepatitis B without delta agent and without hepatic coma: To be on covered with TAF/FTC   Need for birth control: OC to be rx that fit with her ARV  Son with HIV: have urged her to make sure her son takes his ARV  Nausea and poor appetite: zofran and can rx ensure  Dental pain: refer to our ambulatory dentist.   We  spent greater  than 40  minutes with the patient including greater than 50% of time in face to face counsel of the patient with Swahili telephonic interpreter re her HIV, her new  ARV regimen, her desire for oral contraceptiion her nausea and poor appetitie ,and in coordination of her  care.

## 2015-11-07 NOTE — BH Specialist Note (Signed)
Counselor met with Marie Olson today in the exam room for a warm hand off per Dr. Tommy Medal request.  Patient has a history of non compliance i.e. Her children contracted HIV as a result of her noon compliance during pregnancy.  Patient was accompanied by a translator via the phone. Patient was oriented times four with good affect and dress.  Patient was alert and shared freely through the translator.  Patient was in good spirits and was cooperative. Patient stated that she has had a tough time adjusting to the [place] since arriving her a year ago.  Patient communicated that her husband is supportive of her but otherwise has little support here in the states.  Counselor offered support and encouragement for patient.  Counselor recommended that patient see counselor on regular basis in order to assist with her transition to HIV and compliance with medication/treatment.  Patient agreed.  Patient made appointment for 4 weeks out.   [name], MA, LPC [facility]

## 2015-11-07 NOTE — Patient Instructions (Signed)
rtc to see pharmacy in 2 weeks   RTC to see Dr. Daiva EvesVan Dam in 4 weeks

## 2015-11-08 LAB — T-HELPER CELL (CD4) - (RCID CLINIC ONLY)
CD4 % Helper T Cell: 19 % — ABNORMAL LOW (ref 33–55)
CD4 T Cell Abs: 300 /uL — ABNORMAL LOW (ref 400–2700)

## 2015-11-08 LAB — RPR

## 2015-11-10 LAB — HIV-1 RNA ULTRAQUANT REFLEX TO GENTYP+
HIV 1 RNA Quant: 22838 copies/mL — ABNORMAL HIGH (ref <20)
HIV-1 RNA QUANT, LOG: 4.36 {Log_copies}/mL — AB (ref <1.30)

## 2015-11-11 ENCOUNTER — Other Ambulatory Visit: Payer: Self-pay | Admitting: *Deleted

## 2015-11-11 DIAGNOSIS — B20 Human immunodeficiency virus [HIV] disease: Secondary | ICD-10-CM

## 2015-11-11 MED ORDER — EMTRICITABINE-TENOFOVIR AF 200-25 MG PO TABS: 1.0000 | ORAL_TABLET | Freq: Every day | ORAL | Status: DC

## 2015-11-11 MED ORDER — DARUNAVIR-COBICISTAT 800-150 MG PO TABS: 1.0000 | ORAL_TABLET | Freq: Every day | ORAL | Status: DC

## 2015-11-11 MED FILL — PROMETHAZINE 25 MG TABLET: 25 | 30 days supply | Qty: 30 | Fill #0

## 2015-11-11 NOTE — Telephone Encounter (Signed)
[facility]

## 2015-11-18 LAB — HIV-1 GENOTYPR PLUS

## 2015-11-21 ENCOUNTER — Ambulatory Visit: Payer: Medicaid Other

## 2015-11-21 ENCOUNTER — Ambulatory Visit (INDEPENDENT_AMBULATORY_CARE_PROVIDER_SITE_OTHER): Payer: Self-pay | Admitting: Pharmacist Clinician (PhC)/ Clinical Pharmacy Specialist

## 2015-11-21 DIAGNOSIS — B20 Human immunodeficiency virus [HIV] disease: Secondary | ICD-10-CM

## 2015-11-21 NOTE — Progress Notes (Signed)
Patient ID: Marie Olson, female   DOB: 08/14/1990, 25 y.o.   MRN: [id number] HPI: Marie Olson is a 25 y.o. female who was originally scheduled with pharmacy for f/u but I think that she cancelled through the automate system for some reason. She walked in today.   Allergies: No Known Allergies  Vitals:    Past Medical History: Past Medical History  Diagnosis Date  . HIV (human immunodeficiency virus infection) ([facility])   . Abscess   . Hypertension   . 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 Housing problems 12/24/2014    Social History: Social History   Social History  . Marital Status: Married    Spouse Name: N/A  . Number of Children: N/A  . Years of Education: N/A   Social History Main Topics  . Smoking status: Never Smoker   . Smokeless tobacco: Not on file  . Alcohol Use: No  . Drug Use: Not on file  . Sexual Activity:    Partners: Male    Pharmacist, hospitalBirth Control/ Protection: None   Other Topics Concern  . Not on file   Social History Narrative    Previous Regimen: Genvoya  Current Regimen: Prezcobix/Descovy  Labs: HIV 1 RNA QUANT (copies/mL)  Date Value  11/07/2015 22838*  10/03/2015 23700*  08/02/2015 [id number]*   CD4 T CELL ABS (/uL)  Date Value  11/07/2015 300*  10/03/2015 250*  08/02/2015 330*   HEP B S AB (no units)  Date Value  08/17/2014 NEG   HEPATITIS B SURFACE AG (no units)  Date Value  08/17/2014 POSITIVE*   HCV AB (no units)  Date Value  08/17/2014 NEGATIVE    CrCl: CrCl cannot be calculated (Unknown ideal weight.).  Lipids:    Component Value Date/Time   CHOL 181 10/03/2015 1152   TRIG 53 10/03/2015 1152   HDL 59 10/03/2015 1152   CHOLHDL 3.1 10/03/2015 1152   VLDL 11 10/03/2015 1152   LDLCALC 111 10/03/2015 1152    Assessment: Marie Olson came in today after some confusion with the appt. We had to use the swahili translation service to help with communication. She said that she has not  missed doses and was able to point out the meds. However, she has been complaining of dental pain. After looking in her mouth, the back right molar does seem to be an issue. We contacted Marie Olson to see if we can get her into the clinic very soon. They will get in touch with Marie Olson to see if we get her in early. We gave her some ibuprofen to take home to take for toothache and gave direction in swahili with the med calendar. She can't read but she will get her husband to help her.   Recommendations:  Cont Prez/Desc Ibuprofen 400mg  QID PRN for toothache Dental clinic soon  Clide CliffPham, Lamar Meter Quang, PharmD Clinical Infectious Disease Pharmacist [facility] 11/21/2015, 4:51 PM

## 2015-11-28 ENCOUNTER — Other Ambulatory Visit: Payer: Self-pay | Admitting: *Deleted

## 2015-11-28 ENCOUNTER — Other Ambulatory Visit: Payer: Self-pay

## 2015-11-28 DIAGNOSIS — N926 Irregular menstruation, unspecified: Secondary | ICD-10-CM

## 2015-12-04 ENCOUNTER — Encounter: Payer: Self-pay | Admitting: Infectious Disease

## 2015-12-11 ENCOUNTER — Encounter (HOSPITAL_COMMUNITY): Payer: Self-pay | Admitting: *Deleted

## 2015-12-11 ENCOUNTER — Inpatient Hospital Stay (HOSPITAL_COMMUNITY)
Admission: AD | Admit: 2015-12-11 | Discharge: 2015-12-11 | Disposition: A | Discharge status: Home / Self Care | Payer: Medicaid Other | Source: Ambulatory Visit | Attending: Obstetrics & Gynecology | Admitting: Obstetrics & Gynecology

## 2015-12-11 DIAGNOSIS — B20 Human immunodeficiency virus [HIV] disease: Secondary | ICD-10-CM | POA: Insufficient documentation

## 2015-12-11 DIAGNOSIS — N926 Irregular menstruation, unspecified: Secondary | ICD-10-CM | POA: Insufficient documentation

## 2015-12-11 DIAGNOSIS — Z79899 Other long term (current) drug therapy: Secondary | ICD-10-CM | POA: Insufficient documentation

## 2015-12-11 DIAGNOSIS — I10 Essential (primary) hypertension: Secondary | ICD-10-CM | POA: Insufficient documentation

## 2015-12-11 DIAGNOSIS — R1030 Lower abdominal pain, unspecified: Secondary | ICD-10-CM | POA: Insufficient documentation

## 2015-12-11 LAB — POCT PREGNANCY, URINE: PREG TEST UR: NEGATIVE

## 2015-12-11 LAB — URINALYSIS, ROUTINE W REFLEX MICROSCOPIC
Bilirubin Urine: NEGATIVE
Glucose, UA: NEGATIVE mg/dL
Ketones, ur: NEGATIVE mg/dL
LEUKOCYTES UA: NEGATIVE
NITRITE: NEGATIVE
PH: 5.5 (ref 5.0–8.0)
Protein, ur: NEGATIVE mg/dL
SPECIFIC GRAVITY, URINE: 1.02 (ref 1.005–1.030)

## 2015-12-11 LAB — WET PREP, GENITAL
CLUE CELLS WET PREP: NONE SEEN
SPERM: NONE SEEN
TRICH WET PREP: NONE SEEN
YEAST WET PREP: NONE SEEN

## 2015-12-11 LAB — CBC WITH DIFFERENTIAL/PLATELET
BASOS ABS: 0 10*3/uL (ref 0.0–0.1)
Basophils Relative: 0 %
EOS ABS: 0.5 10*3/uL (ref 0.0–0.7)
EOS PCT: 9 %
HCT: 34.8 % — ABNORMAL LOW (ref 36.0–46.0)
Hemoglobin: 12.4 g/dL (ref 12.0–15.0)
LYMPHS ABS: 1.3 10*3/uL (ref 0.7–4.0)
Lymphocytes Relative: 24 %
MCH: 29.9 pg (ref 26.0–34.0)
MCHC: 35.6 g/dL (ref 30.0–36.0)
MCV: 83.9 fL (ref 78.0–100.0)
Monocytes Absolute: 0.3 10*3/uL (ref 0.1–1.0)
Monocytes Relative: 5 %
Neutro Abs: 3.3 10*3/uL (ref 1.7–7.7)
Neutrophils Relative %: 62 %
PLATELETS: 230 10*3/uL (ref 150–400)
RBC: 4.15 MIL/uL (ref 3.87–5.11)
RDW: 13.2 % (ref 11.5–15.5)
WBC: 5.4 10*3/uL (ref 4.0–10.5)

## 2015-12-11 LAB — URINE MICROSCOPIC-ADD ON

## 2015-12-11 NOTE — Discharge Instructions (Signed)
Abnormal Uterine Bleeding °Abnormal uterine bleeding means bleeding from the vagina that is not your normal menstrual period. This can be: °· Bleeding or spotting between periods. °· Bleeding after sex (sexual intercourse). °· Bleeding that is heavier or more than normal. °· Periods that last longer than usual. °· Bleeding after menopause. °There are many problems that may cause this. Treatment will depend on the cause of the bleeding. Any kind of bleeding that is not normal should be reviewed by your doctor.  °HOME CARE °Watch your condition for any changes. These actions may lessen any discomfort you are having: °· Do not use tampons or douches as told by your doctor. °· Change your pads often. °You should get regular pelvic exams and Pap tests. Keep all appointments for tests as told by your doctor. °GET HELP IF: °· You are bleeding for more than 1 week. °· You feel dizzy at times. °GET HELP RIGHT AWAY IF:  °· You pass out. °· You have to change pads every 15 to 30 minutes. °· You have belly pain. °· You have a fever. °· You become sweaty or weak. °· You are passing large blood clots from the vagina. °· You feel sick to your stomach (nauseous) and throw up (vomit). °MAKE SURE YOU: °· Understand these instructions. °· Will watch your condition. °· Will get help right away if you are not doing well or get worse. °  °This information is not intended to replace advice given to you by your health care provider. Make sure you discuss any questions you have with your health care provider. °  °Document Released: 02/15/2009 Document Revised: 04/25/2013 Document Reviewed: 11/17/2012 °Elsevier Interactive Patient Education ©2016 Elsevier Inc. ° °

## 2015-12-11 NOTE — MAU Note (Addendum)
Patient states no period for 2 months and has been bleeding and having abdominal pain that started this morning. Just a little bit of bleeding. Had a miscarriage in January. Was taking BCP's after miscarriage. Stopped in July. Thinks she might be pregnant but has not done a home test.

## 2015-12-11 NOTE — MAU Provider Note (Signed)
History     CSN: [id number]  Arrival date and time: 12/11/15 1113   First Provider Initiated Contact with Patient 12/11/15 1217      Chief Complaint  Patient presents with  . Vaginal Bleeding   HPI [name] is a 25 y.o. [phone] who presents to MAU today with complaint of possible pregnancy and spotting. The patient states ectopic pregnancy in February requiring surgery. After post operative visit she was started on OCPs. She was having regular periods while on OCPs, but discontinued in July. She denies any periods in July, but has had some irregular spotting since then. Spotting is present this morning. She denies vaginal discharge, fever today. She has had some mild intermittent lower abdominal pain. She is not taking any pain medication.   OB History    Gravida Para Term Preterm AB Living   SAB TAB Ectopic Multiple Live Births                  Past Medical History:  Diagnosis Date  . Abscess   . 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 HIV (human immunodeficiency virus infection) ([facility])   . Housing problems 12/24/2014  . Hypertension     Past Surgical History:  Procedure Laterality Date  . DIAGNOSTIC LAPAROSCOPY WITH REMOVAL OF ECTOPIC PREGNANCY N/A 06/03/2015   Procedure: DIAGNOSTIC LAPAROSCOPY,  Left Salpingectomy with  REMOVAL OF ECTOPIC PREGNANCY;  Surgeon: Adam Phenix, MD;  Location: [facility];  Service: Gynecology;  Laterality: N/A;    Family History  Problem Relation Age of Onset  . Family history unknown: Yes    Social History  Substance Use Topics  . Smoking status: Never Smoker  . Smokeless tobacco: Never Used  . Alcohol use No    Allergies: No Known Allergies  Prescriptions Prior to Admission  Medication Sig Dispense Refill Last Dose  . darunavir-cobicistat (PREZCOBIX) 800-150 MG tablet Take 1 tablet by mouth daily. 30 tablet 5 12/10/2015 at Unknown time  . emtricitabine-tenofovir AF  (DESCOVY) 200-25 MG tablet Take 1 tablet by mouth daily. 30 tablet 5 12/10/2015 at Unknown time  . ENSURE (ENSURE) Take 237 mLs by mouth 2 (two) times daily between meals. 237 mL 4 12/10/2015 at Unknown time  . ondansetron (ZOFRAN) 4 MG tablet Take 1 tablet (4 mg total) by mouth every 8 (eight) hours as needed for nausea or vomiting. 30 tablet 2 12/10/2015 at Unknown time    Review of Systems  Constitutional: Negative for fever and malaise/fatigue.  Gastrointestinal: Positive for abdominal pain.  Genitourinary: Negative for dysuria, frequency and urgency.       + vaginal bleeding   Physical Exam   Blood pressure 126/79, pulse 117, temperature 98.6 F (37 C), temperature source Oral, resp. rate 18, last menstrual period 10/25/2014.  Physical Exam  Nursing note and vitals reviewed. Constitutional: She is oriented to person, place, and time. She appears well-developed and well-nourished. No distress.  HENT:  Head: Normocephalic and atraumatic.  Cardiovascular: Normal rate.   Respiratory: Effort normal.  GI: Soft. She exhibits no distension and no mass. There is no tenderness. There is no rebound and no guarding.  Genitourinary: Uterus is not enlarged and not tender. Cervix exhibits no motion tenderness, no discharge and no friability. Right adnexum displays no mass and no tenderness. Left adnexum displays no mass and no tenderness. There is bleeding (small blood pooling in vaginal vault) in the  vagina. No vaginal discharge found.  Neurological: She is alert and oriented to person, place, and time.  Skin: Skin is warm and dry. No erythema.  Psychiatric: She has a normal mood and affect.    Results for orders placed or performed during the hospital encounter of 12/11/15 (from the past 24 hour(s))  Urinalysis, Routine w reflex microscopic ([facility])     Status: Abnormal   Collection Time: 12/11/15 11:26 AM  Result Value Ref Range   Color, Urine YELLOW YELLOW   APPearance CLEAR CLEAR    Specific Gravity, Urine 1.020 1.005 - 1.030   pH 5.5 5.0 - 8.0   Glucose, UA NEGATIVE NEGATIVE mg/dL   Hgb urine dipstick LARGE (A) NEGATIVE   Bilirubin Urine NEGATIVE NEGATIVE   Ketones, ur NEGATIVE NEGATIVE mg/dL   Protein, ur NEGATIVE NEGATIVE mg/dL   Nitrite NEGATIVE NEGATIVE   Leukocytes, UA NEGATIVE NEGATIVE  Urine microscopic-add on     Status: Abnormal   Collection Time: 12/11/15 11:26 AM  Result Value Ref Range   Squamous Epithelial / LPF 0-5 (A) NONE SEEN   WBC, UA 0-5 0 - 5 WBC/hpf   RBC / HPF 6-30 0 - 5 RBC/hpf   Bacteria, UA RARE (A) NONE SEEN  Pregnancy, urine POC     Status: None   Collection Time: 12/11/15 11:36 AM  Result Value Ref Range   Preg Test, Ur NEGATIVE NEGATIVE  CBC with Differential/Platelet     Status: Abnormal   Collection Time: 12/11/15 12:18 PM  Result Value Ref Range   WBC 5.4 4.0 - 10.5 K/uL   RBC 4.15 3.87 - 5.11 MIL/uL   Hemoglobin 12.4 12.0 - 15.0 g/dL   HCT 16.134.8 (L) 09.636.0 - 04.546.0 %   MCV 83.9 78.0 - 100.0 fL   MCH 29.9 26.0 - 34.0 pg   MCHC 35.6 30.0 - 36.0 g/dL   RDW 40.913.2 81.111.5 - 91.415.5 %   Platelets 230 150 - 400 K/uL   Neutrophils Relative % 62 %   Neutro Abs 3.3 1.7 - 7.7 K/uL   Lymphocytes Relative 24 %   Lymphs Abs 1.3 0.7 - 4.0 K/uL   Monocytes Relative 5 %   Monocytes Absolute 0.3 0.1 - 1.0 K/uL   Eosinophils Relative 9 %   Eosinophils Absolute 0.5 0.0 - 0.7 K/uL   Basophils Relative 0 %   Basophils Absolute 0.0 0.0 - 0.1 K/uL  Wet prep, genital     Status: Abnormal   Collection Time: 12/11/15 12:35 PM  Result Value Ref Range   Yeast Wet Prep HPF POC NONE SEEN NONE SEEN   Trich, Wet Prep NONE SEEN NONE SEEN   Clue Cells Wet Prep HPF POC NONE SEEN NONE SEEN   WBC, Wet Prep HPF POC FEW (A) NONE SEEN   Sperm NONE SEEN     MAU Course  Procedures None  MDM UPT - negative UA, CBC, wet prep, GC/Chlamydia and RPR today  Given amount of blood noted on pelvic exam, most likely onset of menses.   Assessment and Plan   A: Menses, irregular  P: Discharge home Bleeding precautions discussed Patient advised to follow-up with [facility] if periods remain irregular after discontinuation of OCPs for further evaluation and management Otherwise follow-up with PCP in [place] for routine care Patient may return to MAU as needed or if her condition were to change or worsen   Marny LowensteinJulie N Kerrigan Gombos, PA-C  12/11/2015, 1:01 PM

## 2015-12-12 LAB — GC/CHLAMYDIA PROBE AMP (~~LOC~~) NOT AT ARMC
CHLAMYDIA, DNA PROBE: NEGATIVE
Neisseria Gonorrhea: NEGATIVE

## 2015-12-12 LAB — RPR: RPR Ser Ql: NONREACTIVE

## 2015-12-18 ENCOUNTER — Ambulatory Visit (INDEPENDENT_AMBULATORY_CARE_PROVIDER_SITE_OTHER): Payer: Self-pay | Admitting: Infectious Disease

## 2015-12-18 ENCOUNTER — Encounter: Payer: Self-pay | Admitting: Infectious Disease

## 2015-12-18 VITALS — BP 128/82 | HR 91 | Temp 98.2°F | Wt 91.2 lb

## 2015-12-18 DIAGNOSIS — Z23 Encounter for immunization: Secondary | ICD-10-CM

## 2015-12-18 DIAGNOSIS — Z113 Encounter for screening for infections with a predominantly sexual mode of transmission: Secondary | ICD-10-CM

## 2015-12-18 DIAGNOSIS — Z91199 Patient's noncompliance with other medical treatment and regimen due to unspecified reason: Secondary | ICD-10-CM

## 2015-12-18 DIAGNOSIS — Z9119 Patient's noncompliance with other medical treatment and regimen: Secondary | ICD-10-CM

## 2015-12-18 DIAGNOSIS — B181 Chronic viral hepatitis B without delta-agent: Secondary | ICD-10-CM

## 2015-12-18 DIAGNOSIS — B20 Human immunodeficiency virus [HIV] disease: Secondary | ICD-10-CM

## 2015-12-18 DIAGNOSIS — Z603 Acculturation difficulty: Secondary | ICD-10-CM

## 2015-12-18 LAB — COMPLETE METABOLIC PANEL WITH GFR
ALBUMIN: 4.1 g/dL (ref 3.6–5.1)
ALK PHOS: 55 U/L (ref 33–115)
ALT: 10 U/L (ref 6–29)
AST: 26 U/L (ref 10–30)
BILIRUBIN TOTAL: 0.2 mg/dL (ref 0.2–1.2)
BUN: 11 mg/dL (ref 7–25)
CO2: 24 mmol/L (ref 20–31)
Calcium: 8.9 mg/dL (ref 8.6–10.2)
Chloride: 103 mmol/L (ref 98–110)
Creat: 0.65 mg/dL (ref 0.50–1.10)
GLUCOSE: 84 mg/dL (ref 65–99)
POTASSIUM: 3.4 mmol/L — AB (ref 3.5–5.3)
SODIUM: 136 mmol/L (ref 135–146)
Total Protein: 8.5 g/dL — ABNORMAL HIGH (ref 6.1–8.1)

## 2015-12-18 LAB — CBC WITH DIFFERENTIAL/PLATELET
BASOS ABS: 49 {cells}/uL (ref 0–200)
BASOS PCT: 1 %
EOS PCT: 17 %
Eosinophils Absolute: 833 cells/uL — ABNORMAL HIGH (ref 15–500)
HCT: 32.8 % — ABNORMAL LOW (ref 35.0–45.0)
HEMOGLOBIN: 10.9 g/dL — AB (ref 11.7–15.5)
LYMPHS ABS: 1568 {cells}/uL (ref 850–3900)
Lymphocytes Relative: 32 %
MCH: 29 pg (ref 27.0–33.0)
MCHC: 33.2 g/dL (ref 32.0–36.0)
MCV: 87.2 fL (ref 80.0–100.0)
MONOS PCT: 9 %
MPV: 9.8 fL (ref 7.5–12.5)
Monocytes Absolute: 441 cells/uL (ref 200–950)
NEUTROS ABS: 2009 {cells}/uL (ref 1500–7800)
Neutrophils Relative %: 41 %
PLATELETS: 228 10*3/uL (ref 140–400)
RBC: 3.76 MIL/uL — ABNORMAL LOW (ref 3.80–5.10)
RDW: 14 % (ref 11.0–15.0)
WBC: 4.9 10*3/uL (ref 3.8–10.8)

## 2015-12-18 NOTE — Progress Notes (Signed)
Chief complaint:  Here for follow-up for HIV  On ARV with nausea and poor appetite  Subjective:    Patient ID: Marie Olson, female    DOB: 01/21/1991, 25 y.o.   MRN: [id number]  HPI  25 year old Swahili speaking African lady with newly diagnosed HIV, here with HIV + husband . She had fairly high viral load and CD4 just above 200 when we first checked it.  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   She has had very poor compliance and her child's HIV has also NOT been controlled.  We tried Genvoya for her but due to her terrible compliance changed her over to PI based therapy.  She is having nausea and poor appetite but claims to be taking her meds. Labs have NEVER borne that out in recent history.   She has seen the dentist who removed two teeth and she has been on some rx pain meds for this and about to run out. She correctly identifies Prezcobix and Descovy and states she has been taking these since July.  She says the nausea she had been experiences is gone.     Lab Results  Component Value Date   HIV1RNAQUANT 22,838 (H) 11/07/2015   HIV1RNAQUANT 23,700 (H) 10/03/2015   HIV1RNAQUANT 39,281 (H) 08/02/2015     Lab Results  Component Value Date   CD4TABS 300 (L) 11/07/2015   CD4TABS 250 (L) 10/03/2015   CD4TABS 330 (L) 08/02/2015    Past Medical History:  Diagnosis Date  . Abscess   . 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 HIV (human immunodeficiency virus infection) (HCC)   . Housing problems 12/24/2014  . Hypertension     Past Surgical History:  Procedure Laterality Date  . DIAGNOSTIC LAPAROSCOPY WITH REMOVAL OF ECTOPIC PREGNANCY N/A 06/03/2015   Procedure: DIAGNOSTIC LAPAROSCOPY,  Left Salpingectomy with  REMOVAL OF ECTOPIC PREGNANCY;  Surgeon: Adam PhenixJames G Arnold, MD;  Location: [facility];  Service: Gynecology;  Laterality: N/A;    Family History  Problem Relation Age of Onset  . Family  history unknown: Yes      Social History   Social History  . Marital status: Married    Spouse name: N/A  . Number of children: N/A  . Years of education: N/A   Social History Main Topics  . Smoking status: Never Smoker  . Smokeless tobacco: Never Used  . Alcohol use No  . Drug use: No  . Sexual activity: Yes    Partners: Male    Birth control/ protection: None   Other Topics Concern  . None   Social History Narrative  . None    No Known Allergies   Current Outpatient Prescriptions:  .  darunavir-cobicistat (PREZCOBIX) 800-150 MG tablet, Take 1 tablet by mouth daily., Disp: 30 tablet, Rfl: 5 .  emtricitabine-tenofovir AF (DESCOVY) 200-25 MG tablet, Take 1 tablet by mouth daily., Disp: 30 tablet, Rfl: 5 .  ENSURE (ENSURE), Take 237 mLs by mouth 2 (two) times daily between meals., Disp: 237 mL, Rfl: 4 .  ondansetron (ZOFRAN) 4 MG tablet, Take 1 tablet (4 mg total) by mouth every 8 (eight) hours as needed for nausea or vomiting., Disp: 30 tablet, Rfl: 2   Review of Systems  Constitutional: Positive for appetite change. Negative for activity change, chills, diaphoresis, fatigue, fever and unexpected weight change.  HENT: Positive for dental problem. Negative for congestion, rhinorrhea, sinus pressure, sneezing,  sore throat and trouble swallowing.   Eyes: Negative for photophobia and visual disturbance.  Respiratory: Negative for cough, chest tightness, shortness of breath, wheezing and stridor.   Cardiovascular: Negative for chest pain, palpitations and leg swelling.  Gastrointestinal: Negative for abdominal distention, abdominal pain, anal bleeding, blood in stool, constipation, diarrhea and vomiting.  Genitourinary: Negative for difficulty urinating, dysuria, flank pain and hematuria.  Musculoskeletal: Negative for arthralgias, back pain, gait problem, joint swelling and myalgias.  Skin: Negative for color change, pallor, rash and wound.  Neurological: Negative for  tremors, weakness and light-headedness.  Hematological: Negative for adenopathy. Does not bruise/bleed easily.  Psychiatric/Behavioral: Negative for agitation, behavioral problems, confusion, decreased concentration, dysphoric mood and sleep disturbance.       Objective:   Physical Exam  Constitutional: She is oriented to person, place, and time. She appears well-nourished. No distress.  HENT:  Head: Normocephalic and atraumatic.  Mouth/Throat: Uvula is midline and oropharynx is clear and moist. No oropharyngeal exudate.  Patient holding his mouth  Eyes: Conjunctivae and EOM are normal. No scleral icterus.  Neck: Normal range of motion. Neck supple.  Cardiovascular: Normal rate and regular rhythm.   Pulmonary/Chest: Effort normal. No respiratory distress. She has no wheezes.  Abdominal: She exhibits no distension.  Musculoskeletal: She exhibits no edema.  Neurological: She is alert and oriented to person, place, and time. She exhibits normal muscle tone. Coordination normal.  Skin: Skin is warm and dry. No rash noted. She is not diaphoretic. No erythema. No pallor.  Psychiatric: She has a normal mood and affect. Her behavior is normal. Judgment and thought content normal.          Assessment & Plan:   HIV: continue Prezcobix and Descovy and re-enrolled into ADAP, check labs today  Chronic hepatitis B without delta agent and without hepatic coma: To be on covered with TAF/FTC   Dental pain: sp removal of 2 teeth, recommended naproxen for when her narcotic runs out   We  spent greater than 25  minutes with the patient including greater than 50% of time in face to face counsel of the patient with Swahili telephonic interpreter re her HIV, her new  ARV regimen, and her tooth pain and in coordination of her  care.

## 2015-12-19 LAB — RPR

## 2015-12-19 LAB — MICROALBUMIN / CREATININE URINE RATIO
CREATININE, URINE: 285 mg/dL (ref 20–320)
MICROALB UR: 1.2 mg/dL
Microalb Creat Ratio: 4 mcg/mg creat (ref <30)

## 2015-12-20 LAB — URINE CYTOLOGY ANCILLARY ONLY
Chlamydia: NEGATIVE
Neisseria Gonorrhea: NEGATIVE

## 2015-12-20 LAB — T-HELPER CELL (CD4) - (RCID CLINIC ONLY)
CD4 T CELL ABS: 270 /uL — AB (ref 400–2700)
CD4 T CELL HELPER: 17 % — AB (ref 33–55)

## 2015-12-22 LAB — HIV RNA, RTPCR W/R GT (RTI, PI,INT)
HIV 1 RNA QUANT: 51000 {copies}/mL — AB
HIV-1 RNA Quant, Log: 4.71 Log copies/mL — ABNORMAL HIGH

## 2015-12-23 ENCOUNTER — Telehealth: Payer: Self-pay | Admitting: *Deleted

## 2015-12-23 NOTE — Telephone Encounter (Signed)
Per Dr. Daiva EvesVan Dam patient needs Pharmacy Clinic appt ASAP.  Requested that the patient's sponsor assist her in making and keeping this appointment.  Dr. Daiva EvesVan Dam is very concerned about the patient medication compliance and understanding of how to take her medications correctly.

## 2015-12-23 NOTE — Telephone Encounter (Signed)
-----   Message from Randall Hissornelius N Van Dam, MD sent at 12/22/2015 10:03 PM EDT ----- She needs to come and meet with pharmacy in next two weeks. If she does not  Show improvement in her ARV I am going to just stop her ARV prescription

## 2015-12-24 NOTE — Telephone Encounter (Signed)
Patient's sponsor able to get in touch with the patient.  Arranged Pharmacy Counseling appointment so that sponsor would be able to attend with the patient for sept.6 at 1:30PM.  RN will notify Pharmacist.

## 2015-12-26 ENCOUNTER — Other Ambulatory Visit: Payer: Self-pay | Admitting: *Deleted

## 2015-12-26 LAB — HIV-1 GENOTYPE: HIV-1 GENOTYPE: DETECTED — AB

## 2015-12-26 MED ORDER — ENSURE PO LIQD: 237.0000 mL | Freq: Two times a day (BID) | ORAL | 5 refills | Status: DC

## 2015-12-26 NOTE — Telephone Encounter (Signed)
[facility] needing Ensure rx for ptatient.

## 2015-12-28 LAB — RFLX HIV-1 INTEGRASE GENOTYPE

## 2015-12-29 NOTE — Telephone Encounter (Signed)
Thanks TEPPCO PartnersDenise. I greatly appreciate it. I am also very worried she and husband are not giving her meds to her son (who also has TB)  Husband is ONLY one that I know that is consistently taking meds. I think she also needs to see Lennox LaityJodi at Rx visit

## 2016-01-08 ENCOUNTER — Ambulatory Visit (INDEPENDENT_AMBULATORY_CARE_PROVIDER_SITE_OTHER): Payer: Self-pay | Admitting: Pharmacist Clinician (PhC)/ Clinical Pharmacy Specialist

## 2016-01-08 ENCOUNTER — Ambulatory Visit (INDEPENDENT_AMBULATORY_CARE_PROVIDER_SITE_OTHER): Payer: Self-pay | Admitting: *Deleted

## 2016-01-08 DIAGNOSIS — B20 Human immunodeficiency virus [HIV] disease: Secondary | ICD-10-CM

## 2016-01-08 MED ORDER — MIRTAZAPINE 7.5 MG PO TABS: 7.5000 mg | ORAL_TABLET | Freq: Every day | ORAL | 5 refills | Status: DC

## 2016-01-08 NOTE — Patient Instructions (Signed)
Start the appetite stimulant Remeron 7.5mg  daily at bedtime Continue Prezcobix 1 tablet daily Continue Descovy 1 tablet daily

## 2016-01-08 NOTE — Progress Notes (Signed)
Patient ID: Royden Purlereza Semple, female   DOB: 06/27/1990, 25 y.o.   MRN: [id number] HPI: Royden Purlereza Bischoff is a 25 y.o. female who is here for her HIV f/u with pharmacy to discuss compliance.   Allergies: No Known Allergies  Vitals:    Past Medical History: Past Medical History:  Diagnosis Date  . Abscess   . 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 HIV (human immunodeficiency virus infection) (HCC)   . Housing problems 12/24/2014  . Hypertension     Social History: Social History   Social History  . Marital status: Married    Spouse name: N/A  . Number of children: N/A  . Years of education: N/A   Social History Main Topics  . Smoking status: Never Smoker  . Smokeless tobacco: Never Used  . Alcohol use No  . Drug use: No  . Sexual activity: Yes    Partners: Male    Birth control/ protection: None     Comment: declined   Other Topics Concern  . Not on file   Social History Narrative  . No narrative on file    Previous Regimen: Genvoya  Current Regimen: Prez/Descovy  Labs: HIV 1 RNA Quant (copies/mL)  Date Value  12/18/2015 51,000 (H)  11/07/2015 40,98122,838 (H)  10/03/2015 23,700 (H)   CD4 T Cell Abs (/uL)  Date Value  12/19/2015 270 (L)  11/07/2015 300 (L)  10/03/2015 250 (L)   Hep B S Ab (no units)  Date Value  08/17/2014 NEG   Hepatitis B Surface Ag (no units)  Date Value  08/17/2014 POSITIVE (A)   HCV Ab (no units)  Date Value  08/17/2014 NEGATIVE    CrCl: CrCl cannot be calculated (Unknown ideal weight.).  Lipids:    Component Value Date/Time   CHOL 181 10/03/2015 1152   TRIG 53 10/03/2015 1152   HDL 59 10/03/2015 1152   CHOLHDL 3.1 10/03/2015 1152   VLDL 11 10/03/2015 1152   LDLCALC 111 10/03/2015 1152    Assessment: [name] [facility]. She was previously started on Genvoya but due to her spotty adherence, we decided to put her on a PI regimen. She stated  that due to her coverage issue in the past, she had some issues with her meds. She no longer has Medicaid. She has been approved for ADAP per Lynden Angathy. Through the translator, I asked her to point out her current regimen and she did pick the correct one. Previously, she also had issue with nausea with the regimen. We ended up giving her some zofran and the nausea is no longer an issue.   She does have some issue with lack of appetite that she does take some ensure but still feel like her appetite is not that good. I'm going to give her some Remeron to help with both appetite and antidepressant effect. She was adamant about not missing doses in the last month. We are going to get a VL today to make sure that it's down. She also complained of some mouth irritation. Did not look back when she opened her mouth. She has an appt with Dr Daiva EvesVan Dam in 12 days so we told her that it can be further eval at that time.   Recommendations:  Cont Prez/Descovy HIV VL today Start Remeron 7.5mg  PO qHS  Clide CliffPham, Talli Kimmer Quang, PharmD Clinical Infectious Disease Pharmacist [facility] 01/08/2016, 2:38 PM

## 2016-01-09 ENCOUNTER — Encounter: Payer: Self-pay | Admitting: *Deleted

## 2016-01-09 LAB — HIV-1 RNA ULTRAQUANT REFLEX TO GENTYP+
HIV 1 RNA QUANT: 31679 {copies}/mL — AB (ref <20)
HIV-1 RNA Quant, Log: 4.5 Log copies/mL — ABNORMAL HIGH (ref <1.30)

## 2016-01-10 NOTE — Progress Notes (Signed)
RN [facility] a [facility] from [facility]. Today RN meet the patient during her visit with Minh/Pharmacist along with the interpreter. RN explained to the patient my services and that I would love to help with managing/adhering to her regimen. [name] repeatedly stated she continues to take her medication each day without missing any days at all. While speaking with the patient about her medication adherence and diet, [name] stated she does not eat very much. RN asked [name] to explain further why she is not eating much? (for example is she watching her weight or lacking food etc.) [name] stated she no longer has nausea but does not have a appetite. Minh agreed to order Remeron for the patient to increase her appetite, help with sleeping and possibly treat some depression. [name] was very happy with this decision. Maghen agreed to a home visit at the beginning of next week.

## 2016-01-10 NOTE — Patient Instructions (Signed)
Please refer to progress note for details of today's visit 

## 2016-01-15 ENCOUNTER — Encounter: Payer: Self-pay | Admitting: Infectious Disease

## 2016-01-17 LAB — HIV-1 GENOTYPR PLUS

## 2016-01-20 ENCOUNTER — Ambulatory Visit: Payer: Medicaid Other | Admitting: Infectious Disease

## 2016-02-24 ENCOUNTER — Ambulatory Visit: Payer: Medicaid Other | Admitting: Infectious Disease

## 2016-02-24 ENCOUNTER — Ambulatory Visit (INDEPENDENT_AMBULATORY_CARE_PROVIDER_SITE_OTHER): Payer: Self-pay | Admitting: Infectious Disease

## 2016-02-24 ENCOUNTER — Encounter: Payer: Self-pay | Admitting: Infectious Disease

## 2016-02-24 VITALS — BP 158/119 | HR 103 | Temp 98.7°F | Ht 60.0 in | Wt 101.0 lb

## 2016-02-24 DIAGNOSIS — Z23 Encounter for immunization: Secondary | ICD-10-CM

## 2016-02-24 DIAGNOSIS — B20 Human immunodeficiency virus [HIV] disease: Secondary | ICD-10-CM

## 2016-02-24 DIAGNOSIS — B181 Chronic viral hepatitis B without delta-agent: Secondary | ICD-10-CM

## 2016-02-24 DIAGNOSIS — Z603 Acculturation difficulty: Secondary | ICD-10-CM

## 2016-02-24 DIAGNOSIS — Z91199 Patient's noncompliance with other medical treatment and regimen due to unspecified reason: Secondary | ICD-10-CM

## 2016-02-24 DIAGNOSIS — Z9119 Patient's noncompliance with other medical treatment and regimen: Secondary | ICD-10-CM

## 2016-02-24 MED ORDER — DOLUTEGRAVIR SODIUM 50 MG PO TABS
50.0000 mg | ORAL_TABLET | Freq: Every day | ORAL | 11 refills | Status: DC
Start: 2016-02-24 — End: 2016-04-08

## 2016-02-24 MED ORDER — DOLUTEGRAVIR SODIUM 50 MG PO TABS: 50.0000 mg | ORAL_TABLET | Freq: Every day | ORAL | 11 refills | Status: DC

## 2016-02-24 MED ORDER — EMTRICITABINE-TENOFOVIR AF 200-25 MG PO TABS: 1.0000 | ORAL_TABLET | Freq: Every day | ORAL | 11 refills | Status: DC

## 2016-02-24 NOTE — Progress Notes (Signed)
Chief complaint:  Here for follow-up for HIV  Suffering from nausea and vomiting with her ARV  Subjective:    Patient ID: Marie Olson, female    DOB: 09/25/1990, 25 y.o.   MRN: [id number]  HPI  25 year old Swahili speaking African lady with newly diagnosed HIV, here with HIV + husband . She had fairly high viral load and CD4 just above 200 when we first checked it.  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   She has had very poor compliance and her child's HIV has also NOT been controlled.  We tried Genvoya for her but due to her terrible compliance changed her over to PI based therapy.  She continues to show poor adherence with persistent viremia despite multiple clinic visits and her claims to be taking her regimen for several months.     Lab Results  Component Value Date   HIV1RNAQUANT 31,679 (H) 01/08/2016   HIV1RNAQUANT 51,000 (H) 12/18/2015   HIV1RNAQUANT 84,13222,838 (H) 11/07/2015     Lab Results  Component Value Date   CD4TABS 270 (L) 12/19/2015   CD4TABS 300 (L) 11/07/2015   CD4TABS 250 (L) 10/03/2015   I suggested finally to change to Tivicay and Descovy to reduce side effects of nausea. She promised me that she would be adherent to this new regimen.    Past Medical History:  Diagnosis Date  . Abscess   . 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 HIV (human immunodeficiency virus infection) (HCC)   . Housing problems 12/24/2014  . Hypertension     Past Surgical History:  Procedure Laterality Date  . DIAGNOSTIC LAPAROSCOPY WITH REMOVAL OF ECTOPIC PREGNANCY N/A 06/03/2015   Procedure: DIAGNOSTIC LAPAROSCOPY,  Left Salpingectomy with  REMOVAL OF ECTOPIC PREGNANCY;  Surgeon: Adam PhenixJames G Arnold, MD;  Location: [facility];  Service: Gynecology;  Laterality: N/A;    Family History  Problem Relation Age of Onset  . Family history unknown: Yes      Social History   Social History  . Marital  status: Married    Spouse name: N/A  . Number of children: N/A  . Years of education: N/A   Social History Main Topics  . Smoking status: Never Smoker  . Smokeless tobacco: Never Used  . Alcohol use No  . Drug use: No  . Sexual activity: Yes    Partners: Male    Birth control/ protection: None     Comment: pt declined condoms   Other Topics Concern  . None   Social History Narrative  . None    No Known Allergies   Current Outpatient Prescriptions:  .  darunavir-cobicistat (PREZCOBIX) 800-150 MG tablet, Take 1 tablet by mouth daily., Disp: 30 tablet, Rfl: 5 .  emtricitabine-tenofovir AF (DESCOVY) 200-25 MG tablet, Take 1 tablet by mouth daily., Disp: 30 tablet, Rfl: 5 .  ENSURE (ENSURE), Take 237 mLs by mouth 2 (two) times daily between meals., Disp: 237 mL, Rfl: 5 .  mirtazapine (REMERON) 7.5 MG tablet, Take 1 tablet (7.5 mg total) by mouth at bedtime., Disp: 30 tablet, Rfl: 5 .  ondansetron (ZOFRAN) 4 MG tablet, Take 1 tablet (4 mg total) by mouth every 8 (eight) hours as needed for nausea or vomiting., Disp: 30 tablet, Rfl: 2   Review of Systems  Constitutional: Positive for appetite change. Negative for activity change, chills, diaphoresis, fatigue, fever and unexpected weight change.  HENT: Negative for congestion,  rhinorrhea, sinus pressure, sneezing, sore throat and trouble swallowing.   Eyes: Negative for photophobia and visual disturbance.  Respiratory: Negative for cough, chest tightness, shortness of breath, wheezing and stridor.   Cardiovascular: Negative for chest pain, palpitations and leg swelling.  Gastrointestinal: Positive for nausea and vomiting. Negative for abdominal distention, abdominal pain, anal bleeding, blood in stool, constipation and diarrhea.  Genitourinary: Negative for difficulty urinating, dysuria, flank pain and hematuria.  Musculoskeletal: Negative for arthralgias, back pain, gait problem, joint swelling and myalgias.  Skin: Negative for  color change, pallor, rash and wound.  Neurological: Negative for tremors, weakness and light-headedness.  Hematological: Negative for adenopathy. Does not bruise/bleed easily.  Psychiatric/Behavioral: Negative for agitation, behavioral problems, confusion, decreased concentration, dysphoric mood and sleep disturbance.       Objective:   Physical Exam  Constitutional: She is oriented to person, place, and time. She appears well-nourished. No distress.  HENT:  Head: Normocephalic and atraumatic.  Mouth/Throat: Uvula is midline and oropharynx is clear and moist. No oropharyngeal exudate.  Patient holding his mouth  Eyes: Conjunctivae and EOM are normal. No scleral icterus.  Neck: Normal range of motion. Neck supple.  Cardiovascular: Normal rate and regular rhythm.   Pulmonary/Chest: Effort normal. No respiratory distress. She has no wheezes.  Abdominal: She exhibits no distension.  Musculoskeletal: She exhibits no edema.  Neurological: She is alert and oriented to person, place, and time. She exhibits normal muscle tone. Coordination normal.  Skin: Skin is warm and dry. No rash noted. She is not diaphoretic. No erythema. No pallor.  Psychiatric: She has a normal mood and affect. Her behavior is normal. Judgment and thought content normal.          Assessment & Plan:   HIV: she admitted that she is having trouble with the nausea with her HIV meds and thanked me for switch to Tivicay and Descovy. She had suggested return to [facility] but I told her that was not a good regimen for her to be on given her recent compliance. I want her to see pharmacy in 2 weeks and get labs today, then and then see pharmacy again in 4 weeks with labs and see me in 6 weeks.   Chronic hepatitis B without delta agent and without hepatic coma: To be on covered with TAF/FTC  Nausea and vomiting: hopefully this will subside with anti-emetic therapy  Poor appetite: I am hoping by changing ARV therapy we can  improve this. She is NOT to take her ensure at the same time as her ARV. I also told her I would NOT be able to rx marinol for her long term  We  spent greater than 40  minutes with the patient including greater than 50% of time in face to face counsel of the patient with Swahili telephonic interpreter re her HIV, her new  ARV regimen, and her nausea, poor appetite  and in coordination of her  care.

## 2016-02-24 NOTE — Patient Instructions (Signed)
We will get blood work today  Appt with Pharmacy in 2 weeks and 4 weeks  We will get blood work in 2 weeks and 4 weeks    Then appt with Dr. Daiva EvesVan Dam in 6 weeks

## 2016-02-25 LAB — T-HELPER CELL (CD4) - (RCID CLINIC ONLY)
CD4 T CELL ABS: 240 /uL — AB (ref 400–2700)
CD4 T CELL HELPER: 17 % — AB (ref 33–55)

## 2016-02-27 LAB — HIV RNA, RTPCR W/R GT (RTI, PI,INT)
HIV 1 RNA Quant: 5790 copies/mL — ABNORMAL HIGH
HIV-1 RNA Quant, Log: 3.76 Log copies/mL — ABNORMAL HIGH

## 2016-03-03 LAB — RFLX HIV-1 INTEGRASE GENOTYPE

## 2016-03-06 LAB — HIV-1 GENOTYPE: HIV-1 GENOTYPE: DETECTED — AB

## 2016-03-09 ENCOUNTER — Ambulatory Visit: Payer: Medicaid Other

## 2016-03-09 ENCOUNTER — Other Ambulatory Visit: Payer: Medicaid Other

## 2016-03-10 ENCOUNTER — Ambulatory Visit: Payer: Medicaid Other

## 2016-03-10 ENCOUNTER — Other Ambulatory Visit: Payer: Medicaid Other

## 2016-03-11 ENCOUNTER — Other Ambulatory Visit (INDEPENDENT_AMBULATORY_CARE_PROVIDER_SITE_OTHER): Payer: Self-pay

## 2016-03-11 ENCOUNTER — Ambulatory Visit (INDEPENDENT_AMBULATORY_CARE_PROVIDER_SITE_OTHER): Payer: Self-pay | Admitting: Pharmacist Clinician (PhC)/ Clinical Pharmacy Specialist

## 2016-03-11 DIAGNOSIS — B161 Acute hepatitis B with delta-agent without hepatic coma: Secondary | ICD-10-CM

## 2016-03-11 DIAGNOSIS — B20 Human immunodeficiency virus [HIV] disease: Secondary | ICD-10-CM

## 2016-03-11 LAB — COMPLETE METABOLIC PANEL WITH GFR
ALT: 8 U/L (ref 6–29)
AST: 23 U/L (ref 10–30)
Albumin: 4 g/dL (ref 3.6–5.1)
Alkaline Phosphatase: 55 U/L (ref 33–115)
BUN: 14 mg/dL (ref 7–25)
CALCIUM: 9.3 mg/dL (ref 8.6–10.2)
CO2: 23 mmol/L (ref 20–31)
Chloride: 102 mmol/L (ref 98–110)
Creat: 0.74 mg/dL (ref 0.50–1.10)
GFR, Est Non African American: >89 mL/min (ref >=60)
Glucose, Bld: 77 mg/dL (ref 65–99)
POTASSIUM: 3.8 mmol/L (ref 3.5–5.3)
Sodium: 136 mmol/L (ref 135–146)
Total Bilirubin: 0.3 mg/dL (ref 0.2–1.2)
Total Protein: 8.5 g/dL — ABNORMAL HIGH (ref 6.1–8.1)

## 2016-03-11 LAB — CBC WITH DIFFERENTIAL/PLATELET
Basophils Absolute: 0 cells/uL (ref 0–200)
Basophils Relative: 0 %
Eosinophils Absolute: 1180 cells/uL — ABNORMAL HIGH (ref 15–500)
Eosinophils Relative: 20 %
HEMATOCRIT: 34.4 % — AB (ref 35.0–45.0)
Hemoglobin: 11.7 g/dL (ref 11.7–15.5)
LYMPHS PCT: 35 %
Lymphs Abs: 2065 cells/uL (ref 850–3900)
MCH: 29.5 pg (ref 27.0–33.0)
MCHC: 34 g/dL (ref 32.0–36.0)
MCV: 86.9 fL (ref 80.0–100.0)
MONO ABS: 413 {cells}/uL (ref 200–950)
MONOS PCT: 7 %
MPV: 9.3 fL (ref 7.5–12.5)
NEUTROS PCT: 38 %
Neutro Abs: 2242 cells/uL (ref 1500–7800)
PLATELETS: 284 10*3/uL (ref 140–400)
RBC: 3.96 MIL/uL (ref 3.80–5.10)
RDW: 14.4 % (ref 11.0–15.0)
WBC: 5.9 10*3/uL (ref 3.8–10.8)

## 2016-03-11 NOTE — Patient Instructions (Signed)
Continue to take your medications and try not to miss doses. We will see you back at your next visit and get labs on 03/23/16.

## 2016-03-11 NOTE — Progress Notes (Signed)
HPI: Marie Olson is a 25 y.o. female who presents today with her interpreter for HIV follow up. She is also Hepatitis B co-infected.  Allergies: No Known Allergies  Vitals:    Past Medical History: Past Medical History:  Diagnosis Date  . Abscess   . 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 HIV (human immunodeficiency virus infection) (HCC)   . Housing problems 12/24/2014  . Hypertension     Social History: Social History   Social History  . Marital status: Married    Spouse name: N/A  . Number of children: N/A  . Years of education: N/A   Social History Main Topics  . Smoking status: Never Smoker  . Smokeless tobacco: Never Used  . Alcohol use No  . Drug use: No  . Sexual activity: Yes    Partners: Male    Birth control/ protection: None     Comment: pt declined condoms   Other Topics Concern  . Not on file   Social History Narrative  . No narrative on file    Previous Regimen: Prezcobix  Current Regimen: Tivicay + Descovy daily  Labs: HIV 1 RNA Quant (copies/mL)  Date Value  02/24/2016 5,790 (H)  01/08/2016 31,679 (H)  12/18/2015 51,000 (H)   CD4 T Cell Abs (/uL)  Date Value  02/24/2016 240 (L)  12/19/2015 270 (L)  11/07/2015 300 (L)   Hep B S Ab (no units)  Date Value  08/17/2014 NEG   Hepatitis B Surface Ag (no units)  Date Value  08/17/2014 POSITIVE (A)   HCV Ab (no units)  Date Value  08/17/2014 NEGATIVE    CrCl: CrCl cannot be calculated (Patient's most recent lab result is older than the maximum 21 days allowed.).  Lipids:    Component Value Date/Time   CHOL 181 10/03/2015 1152   TRIG 53 10/03/2015 1152   HDL 59 10/03/2015 1152   CHOLHDL 3.1 10/03/2015 1152   VLDL 11 10/03/2015 1152   LDLCALC 111 10/03/2015 1152    Assessment: Marie Olson is a 25 year old female who presents today for pharmacy follow up. Her ART was recently changed from Prezcobix/Descovy to Tivcay/Descovy on  02/24/2016, due to poor compliance and nausea/vomiting. Today, the patient reports excellent adherence to her new regimen and is tolerating her medications well. She was able to identify her pills from the chart and also showed me her medication bottles in her purse. She seems to be taking her medication and only had about ~15 tablets in each bottle left (which is appropriate).   She has a case worker that helps her get her medications and she is going to call her mail order pharmacy to ensure the patients next month will be shipped to Marie Olson's home. We discussed the importance of adherence and to not miss any doses. She had labs drawn today and we will follow up with her in two weeks.   Recommendations: - Continue Tivicay/Descovy daily - Follow up labs with pharmacy clinic in 2 weeks - Follow up appointment scheduled 12/6 with Dr. Emilee HeroVan Dam  Marie Olson, PharmD. PGY-2 Infectious Diseases Pharmacy Resident Pager: [phone] 03/11/2016, 12:18 PM

## 2016-03-11 NOTE — Progress Notes (Signed)
Agreed with Marie Olson. We are getting some hep B labs today also. I "think" that she has been taking her meds. The [facility] will mail her new supply on 11/14 bc that's when the can refill it.

## 2016-03-12 LAB — T-HELPER CELL (CD4) - (RCID CLINIC ONLY)
CD4 T CELL ABS: 370 /uL — AB (ref 400–2700)
CD4 T CELL HELPER: 17 % — AB (ref 33–55)

## 2016-03-12 LAB — HIV-1 RNA QUANT-NO REFLEX-BLD: HIV 1 RNA Quant: <20 copies/mL (ref <20)

## 2016-03-12 NOTE — Telephone Encounter (Signed)
Open in ERROR

## 2016-03-13 LAB — HEPATITIS B E ANTIBODY: HEPATITIS BE ANTIBODY: REACTIVE — AB

## 2016-03-13 LAB — HEPATITIS B E ANTIGEN: Hepatitis Be Antigen: NONREACTIVE

## 2016-03-16 LAB — HEPATITIS B DNA, ULTRAQUANTITATIVE, PCR: Hepatitis B DNA: <20 IU/mL (ref <20)

## 2016-03-23 ENCOUNTER — Ambulatory Visit: Payer: Self-pay | Admitting: Pharmacist

## 2016-03-23 ENCOUNTER — Other Ambulatory Visit: Payer: Self-pay

## 2016-03-23 DIAGNOSIS — B20 Human immunodeficiency virus [HIV] disease: Secondary | ICD-10-CM

## 2016-03-23 NOTE — Progress Notes (Signed)
HPI: Marie Olson is a 25 y.o. female who presents today for HIV follow up.  Allergies: No Known Allergies  Vitals:    Past Medical History: Past Medical History:  Diagnosis Date  . Abscess   . 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 HIV (human immunodeficiency virus infection) (HCC)   . Housing problems 12/24/2014  . Hypertension     Social History: Social History   Social History  . Marital status: Married    Spouse name: N/A  . Number of children: N/A  . Years of education: N/A   Social History Main Topics  . Smoking status: Never Smoker  . Smokeless tobacco: Never Used  . Alcohol use No  . Drug use: No  . Sexual activity: Yes    Partners: Male    Birth control/ protection: None     Comment: pt declined condoms   Other Topics Concern  . Not on file   Social History Narrative  . No narrative on file   Current Regimen: Tivicay + Descovy daily  Labs: HIV 1 RNA Quant (copies/mL)  Date Value  03/11/2016 <20  02/24/2016 5,790 (H)  01/08/2016 31,679 (H)   CD4 T Cell Abs (/uL)  Date Value  03/11/2016 370 (L)  02/24/2016 240 (L)  12/19/2015 270 (L)   Hep B S Ab (no units)  Date Value  08/17/2014 NEG   Hepatitis B Surface Ag (no units)  Date Value  08/17/2014 POSITIVE (A)   HCV Ab (no units)  Date Value  08/17/2014 NEGATIVE    CrCl: CrCl cannot be calculated (Unknown ideal weight.).  Lipids:    Component Value Date/Time   CHOL 181 10/03/2015 1152   TRIG 53 10/03/2015 1152   HDL 59 10/03/2015 1152   CHOLHDL 3.1 10/03/2015 1152   VLDL 11 10/03/2015 1152   LDLCALC 111 10/03/2015 1152    Assessment: Marie Olson is a 25 year old female who presents today for HIV follow up. She reports continued compliance with her Tivicay and Descovy. She brought the bottles today and only has a few tablets left, which seems appropriate. I asked her if she had her refills called in yet and she said no. I spoke with  her case worker and asked her to make sure her refills come in before she runs out of medications. Her case worker stated she is working with Tammy to make sure she gets her medications.  I also reminded her of her appointment on 12/6 with Dr. Daiva EvesVan Dam. I encouraged her to continue to take her medications and not miss any doses.  Recommendations: Continue Tivicay/Descovy Follow up with pharmacy if patient is unable to obtain medication refills Follow up with Dr. Daiva EvesVan Dam on 12/6  Marie Olson, PharmD. PGY-2 Infectious Diseases Pharmacy Resident Pager: [phone] 03/23/2016, 10:33 AM

## 2016-04-08 ENCOUNTER — Ambulatory Visit (INDEPENDENT_AMBULATORY_CARE_PROVIDER_SITE_OTHER): Payer: Self-pay | Admitting: Infectious Disease

## 2016-04-08 ENCOUNTER — Encounter: Payer: Self-pay | Admitting: Infectious Disease

## 2016-04-08 VITALS — BP 123/83 | HR 102 | Temp 99.6°F

## 2016-04-08 DIAGNOSIS — B181 Chronic viral hepatitis B without delta-agent: Secondary | ICD-10-CM

## 2016-04-08 DIAGNOSIS — R1310 Dysphagia, unspecified: Secondary | ICD-10-CM

## 2016-04-08 DIAGNOSIS — R1319 Other dysphagia: Secondary | ICD-10-CM

## 2016-04-08 DIAGNOSIS — Z603 Acculturation difficulty: Secondary | ICD-10-CM

## 2016-04-08 DIAGNOSIS — B20 Human immunodeficiency virus [HIV] disease: Secondary | ICD-10-CM

## 2016-04-08 DIAGNOSIS — R0989 Other specified symptoms and signs involving the circulatory and respiratory systems: Secondary | ICD-10-CM | POA: Insufficient documentation

## 2016-04-08 HISTORY — DX: Dysphagia, unspecified: R13.10

## 2016-04-08 HISTORY — DX: Other specified symptoms and signs involving the circulatory and respiratory systems: R09.89

## 2016-04-08 MED ORDER — EMTRICITABINE-TENOFOVIR AF 200-25 MG PO TABS: 1.0000 | ORAL_TABLET | Freq: Every day | ORAL | 11 refills | Status: DC

## 2016-04-08 MED ORDER — DOLUTEGRAVIR SODIUM 50 MG PO TABS: 50.0000 mg | ORAL_TABLET | Freq: Every day | ORAL | 11 refills | Status: DC

## 2016-04-08 NOTE — Patient Instructions (Signed)
Try Prilosec 20mg  OTC medicine daily for the next 2 months  Come back in January for labs and to renew ADAP. Amy from [facility] can help with this also

## 2016-04-08 NOTE — Progress Notes (Signed)
Chief complaint:  Here for follow-up for HIV he has had a bit of a sore throat or recent fever she also complains of chronic problems of sensation of a structure in her esophagus getting caught in causing 100 show pH she says this was [place] a medicine in [place].  Subjective:    Patient ID: Marie Olson, female    DOB: 12/25/1990, 25 y.o.   MRN: [id number]  HPI  25 year old Swahili speaking African lady with newly diagnosed HIV, here with HIV + husband . She had fairly high viral load and CD4 just above 200 when we first checked it.  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   We tried Genvoya for her but due to her terrible compliance changed her over to PI based therapy.  And she continue to show [employer] [place] with this regimen we'll to my [facility] DESC[facility] and she responded dramatically was free happy with the new regimen. She also became undetectable and her CD4 count improved. She is in good spirits today other than the complaints about the dysphasia.  She and her husband are trying to conceive another child.      Lab Results  Component Value Date   HIV1RNAQUANT <20 03/11/2016   HIV1RNAQUANT 5,790 (H) 02/24/2016   HIV1RNAQUANT 31,679 (H) 01/08/2016     Lab Results  Component Value Date   CD4TABS 370 (L) 03/11/2016   CD4TABS 240 (L) 02/24/2016   CD4TABS 270 (L) 12/19/2015  n.    Past Medical History:  Diagnosis Date  . Abscess   . 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 HIV (human immunodeficiency virus infection) (HCC)   . Housing problems 12/24/2014  . Hypertension     Past Surgical History:  Procedure Laterality Date  . DIAGNOSTIC LAPAROSCOPY WITH REMOVAL OF ECTOPIC PREGNANCY N/A 06/03/2015   Procedure: DIAGNOSTIC LAPAROSCOPY,  Left Salpingectomy with  REMOVAL OF ECTOPIC PREGNANCY;  Surgeon: Adam PhenixJames G Arnold, MD;  Location: [facility];  Service: Gynecology;   Laterality: N/A;    Family History  Problem Relation Age of Onset  . Family history unknown: Yes      Social History   Social History  . Marital status: Married    Spouse name: N/A  . Number of children: N/A  . Years of education: N/A   Social History Main Topics  . Smoking status: Never Smoker  . Smokeless tobacco: Never Used  . Alcohol use No  . Drug use: No  . Sexual activity: Yes    Partners: Male    Birth control/ protection: None     Comment: pt declined condoms   Other Topics Concern  . None   Social History Narrative  . None    No Known Allergies   Current Outpatient Prescriptions:  .  dolutegravir (TIVICAY) 50 MG tablet, Take 1 tablet (50 mg total) by mouth daily., Disp: 30 tablet, Rfl: 11 .  emtricitabine-tenofovir AF (DESCOVY) 200-25 MG tablet, Take 1 tablet by mouth daily., Disp: 30 tablet, Rfl: 11 .  ENSURE (ENSURE), Take 237 mLs by mouth 2 (two) times daily between meals., Disp: 237 mL, Rfl: 5 .  mirtazapine (REMERON) 7.5 MG tablet, Take 1 tablet (7.5 mg total) by mouth at bedtime., Disp: 30 tablet, Rfl: 5 .  ondansetron (ZOFRAN) 4 MG tablet, Take 1 tablet (4 mg total) by mouth every 8 (eight) hours as needed for nausea or  vomiting., Disp: 30 tablet, Rfl: 2   Review of Systems  Constitutional: Positive for fatigue and fever. Negative for activity change, chills, diaphoresis and unexpected weight change.  HENT: Positive for congestion, sore throat and trouble swallowing. Negative for rhinorrhea, sinus pressure and sneezing.   Eyes: Negative for photophobia and visual disturbance.  Respiratory: Negative for cough, chest tightness, shortness of breath, wheezing and stridor.   Cardiovascular: Negative for chest pain, palpitations and leg swelling.  Gastrointestinal: Negative for abdominal distention, abdominal pain, anal bleeding, blood in stool, constipation and diarrhea.  Genitourinary: Negative for difficulty urinating, dysuria, flank pain and  hematuria.  Musculoskeletal: Negative for arthralgias, back pain, gait problem, joint swelling and myalgias.  Skin: Negative for color change, pallor, rash and wound.  Neurological: Negative for tremors, weakness and light-headedness.  Hematological: Negative for adenopathy. Does not bruise/bleed easily.  Psychiatric/Behavioral: Negative for agitation, behavioral problems, confusion, decreased concentration, dysphoric mood and sleep disturbance.       Objective:   Physical Exam  Constitutional: She is oriented to person, place, and time. She appears well-nourished. No distress.  HENT:  Head: Normocephalic and atraumatic.  Mouth/Throat: Uvula is midline and oropharynx is clear and moist. No oropharyngeal exudate.  Patient holding his mouth  Eyes: Conjunctivae and EOM are normal. No scleral icterus.  Neck: Normal range of motion. Neck supple.  Cardiovascular: Normal rate and regular rhythm.   Pulmonary/Chest: Effort normal. No respiratory distress. She has no wheezes.  Abdominal: She exhibits no distension.  Musculoskeletal: She exhibits no edema.  Neurological: She is alert and oriented to person, place, and time. She exhibits normal muscle tone. Coordination normal.  Skin: Skin is warm and dry. No rash noted. She is not diaphoretic. No erythema. No pallor.  Psychiatric: She has a normal mood and affect. Her behavior is normal. Judgment and thought content normal.          Assessment & Plan:   HIV: Really thrilled at how well she is doing now with her viral load now undetectable and her CD4 count immune reconstitution underway. Told her to please stay on this past and to continue stay suppressed this will protect her and protect any baby she should try to have. Her husband also needs to continue be compliant with his antiretrovirals. Hopefully her child is also being compliant with his medications [facility]   Chronic hepatitis B without delta agent and  without hepatic coma: To be on covered with TAF/FTC  Nausea and vomiting: Decided with change in regimen  Dysphagia: Not quite sure what she is talking that with regards to this sensation of a structure in her throat. I will try a proton pump inhibitor therapy for the next 2 months if this helps if not we need to add an orange card and order to have her seen by by gastroenterology  Recent fever and sore throat: Likely viral URI treat symptomatically    We  spent greater than 40  minutes with the patient including greater than 50% of time in face to face counsel of the patient with in person  Swahili interpreter re her HIV, her new  ARV regimen, and her dysphagia fevers sore throat and in coordination of her  care.

## 2016-04-15 ENCOUNTER — Ambulatory Visit: Payer: Medicaid Other

## 2016-04-16 ENCOUNTER — Emergency Department (HOSPITAL_COMMUNITY)
Admission: EM | Admit: 2016-04-16 | Discharge: 2016-04-17 | Disposition: A | Discharge status: Home / Self Care | Payer: Self-pay | Attending: Emergency Medicine | Admitting: Emergency Medicine

## 2016-04-16 ENCOUNTER — Encounter (HOSPITAL_COMMUNITY): Payer: Self-pay | Admitting: Emergency Medicine

## 2016-04-16 DIAGNOSIS — I10 Essential (primary) hypertension: Secondary | ICD-10-CM | POA: Insufficient documentation

## 2016-04-16 DIAGNOSIS — R531 Weakness: Secondary | ICD-10-CM | POA: Insufficient documentation

## 2016-04-16 DIAGNOSIS — Z79899 Other long term (current) drug therapy: Secondary | ICD-10-CM | POA: Insufficient documentation

## 2016-04-16 LAB — CBC WITH DIFFERENTIAL/PLATELET
BASOS PCT: 1 %
Basophils Absolute: 0 10*3/uL (ref 0.0–0.1)
EOS ABS: 0.5 10*3/uL (ref 0.0–0.7)
EOS PCT: 9 %
HCT: 35.6 % — ABNORMAL LOW (ref 36.0–46.0)
Hemoglobin: 12.3 g/dL (ref 12.0–15.0)
LYMPHS ABS: 2.5 10*3/uL (ref 0.7–4.0)
Lymphocytes Relative: 45 %
MCH: 29.2 pg (ref 26.0–34.0)
MCHC: 34.6 g/dL (ref 30.0–36.0)
MCV: 84.6 fL (ref 78.0–100.0)
MONOS PCT: 8 %
Monocytes Absolute: 0.4 10*3/uL (ref 0.1–1.0)
Neutro Abs: 2 10*3/uL (ref 1.7–7.7)
Neutrophils Relative %: 37 %
PLATELETS: 269 10*3/uL (ref 150–400)
RBC: 4.21 MIL/uL (ref 3.87–5.11)
RDW: 13.6 % (ref 11.5–15.5)
WBC: 5.4 10*3/uL (ref 4.0–10.5)

## 2016-04-16 LAB — COMPREHENSIVE METABOLIC PANEL
ALBUMIN: 4 g/dL (ref 3.5–5.0)
ALT: 13 U/L — ABNORMAL LOW (ref 14–54)
ANION GAP: 10 (ref 5–15)
AST: 33 U/L (ref 15–41)
Alkaline Phosphatase: 59 U/L (ref 38–126)
BUN: 11 mg/dL (ref 6–20)
CHLORIDE: 106 mmol/L (ref 101–111)
CO2: 20 mmol/L — AB (ref 22–32)
Calcium: 9.1 mg/dL (ref 8.9–10.3)
Creatinine, Ser: 1 mg/dL (ref 0.44–1.00)
GFR calc non Af Amer: >60 mL/min (ref >60)
Glucose, Bld: 102 mg/dL — ABNORMAL HIGH (ref 65–99)
Potassium: 3.6 mmol/L (ref 3.5–5.1)
SODIUM: 136 mmol/L (ref 135–145)
Total Bilirubin: 0.5 mg/dL (ref 0.3–1.2)
Total Protein: 9.7 g/dL — ABNORMAL HIGH (ref 6.5–8.1)

## 2016-04-16 LAB — URINALYSIS, ROUTINE W REFLEX MICROSCOPIC
BILIRUBIN URINE: NEGATIVE
GLUCOSE, UA: NEGATIVE mg/dL
HGB URINE DIPSTICK: NEGATIVE
KETONES UR: 5 mg/dL — AB
NITRITE: NEGATIVE
PH: 5 (ref 5.0–8.0)
Protein, ur: NEGATIVE mg/dL
Specific Gravity, Urine: 1.021 (ref 1.005–1.030)

## 2016-04-16 LAB — I-STAT BETA HCG BLOOD, ED (MC, WL, AP ONLY): I-stat hCG, quantitative: <5 m[IU]/mL (ref <5)

## 2016-04-16 MED ORDER — ACETAMINOPHEN 500 MG PO TABS
1000.0000 mg | ORAL_TABLET | Freq: Once | ORAL | Status: AC
  Administered 2016-04-17: 1000 mg via ORAL
  Filled 2016-04-16: qty 2

## 2016-04-16 NOTE — ED Triage Notes (Signed)
Pt. arrived with EMS from home , patient reports generalized weakness onset this morning , Suwahili language interpreter service utilized at triage .

## 2016-04-16 NOTE — ED Provider Notes (Signed)
By signing my name below, I, Alyssa GroveMartin Green, attest that this documentation has been prepared under the direction and in the presence of Crystina Borrayo N Shunda Rabadi, DO. Electronically Signed: Alyssa GroveMartin Green, ED Scribe. 04/16/16. 11:54 PM.   TIME SEEN: 11:42 PM   CHIEF COMPLAINT: Generalized weakness  HPI: Marie Olson is a 25 y.o. female with PMHx of HTN, HIV and Chronic Hepatitis B who presents to the Emergency Department complaining of gradual onset, constant generalized weakness onset earlier this morning. Pt has not recently missed any dosages of her medications. Pt has not received a flu vaccination this year. She [place]. She denies fever, chills, pain including headache, chest pain, abdominal pain; no shortness of breath or vomiting, diarrhea, dysuria, hematuria, vaginal bleeding, vaginal discharge.   It appears her last CD4 count was 370 on 03/11/2016. Viral load at that time was less than 20.  Swahili language interpreter utilized.  ROS: See HPI Constitutional: no fever  Eyes: no drainage  ENT: no runny nose   Cardiovascular:  no chest pain  Resp: no SOB  GI: no vomiting GU: no dysuria Integumentary: no rash  Allergy: no hives  Musculoskeletal: no leg swelling  Neurological: no slurred speech ROS otherwise negative  PAST MEDICAL HISTORY/PAST SURGICAL HISTORY:  Past Medical History:  Diagnosis Date  . Abscess   . 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 Dysphagia 04/08/2016  . HIV (human immunodeficiency virus infection) (HCC)   . Housing problems 12/24/2014  . Hypertension   . Symptoms of upper respiratory infection (URI) 04/08/2016    MEDICATIONS:  Prior to Admission medications   Medication Sig Start Date End Date Taking? Authorizing Provider  dolutegravir (TIVICAY) 50 MG tablet Take 1 tablet (50 mg total) by mouth daily. 04/08/16   Randall Hissornelius N Van Dam, MD  emtricitabine-tenofovir AF (DESCOVY) 200-25 MG  tablet Take 1 tablet by mouth daily. 04/08/16   Randall Hissornelius N Van Dam, MD  ENSURE (ENSURE) Take 237 mLs by mouth 2 (two) times daily between meals. 12/26/15   Randall Hissornelius N Van Dam, MD  mirtazapine (REMERON) 7.5 MG tablet Take 1 tablet (7.5 mg total) by mouth at bedtime. 01/08/16   Randall Hissornelius N Van Dam, MD  ondansetron (ZOFRAN) 4 MG tablet Take 1 tablet (4 mg total) by mouth every 8 (eight) hours as needed for nausea or vomiting. 11/07/15   Randall Hissornelius N Van Dam, MD    ALLERGIES:  No Known Allergies  SOCIAL HISTORY:  Social History  Substance Use Topics  . Smoking status: Never Smoker  . Smokeless tobacco: Never Used  . Alcohol use No    FAMILY HISTORY: Family History  Problem Relation Age of Onset  . Family history unknown: Yes    EXAM: BP 137/96 (BP Location: Left Arm)   Pulse 101   Temp 98.2 F (36.8 C) (Oral)   Resp 19   LMP 03/10/2016 (Approximate)   SpO2 99%  CONSTITUTIONAL: Alert and oriented and responds appropriately to questions. Well-appearing; well-nourished, Oral temperature of 99.3 HEAD: Normocephalic EYES: Conjunctivae clear, PERRL, EOMI ENT: normal nose; no rhinorrhea; moist mucous membranes NECK: Supple, no meningismus, no nuchal rigidity, no LAD  CARD: Regular and Tachycardic; S1 and S2 appreciated; no murmurs, no clicks, no rubs, no gallops RESP: Normal chest excursion without splinting or tachypnea; breath sounds clear and equal bilaterally; no wheezes, no rhonchi, no rales, no hypoxia or respiratory distress, speaking full sentences ABD/GI: Normal bowel sounds; non-distended; soft, non-tender,  no rebound, no guarding, no peritoneal signs, no hepatosplenomegaly BACK:  The back appears normal and is non-tender to palpation, there is no CVA tenderness EXT: Normal ROM in all joints; non-tender to palpation; no edema; normal capillary refill; no cyanosis, no calf tenderness or swelling    SKIN: Normal color for age and race; warm; no rash NEURO: Moves all extremities  equally, sensation to light touch intact diffusely, cranial nerves II through XII intact, normal speech PSYCH: The patient's mood and manner are appropriate. Grooming and personal hygiene are appropriate.  MEDICAL DECISION MAKING: Patient here with generalized weakness. She has an oral temperature 99.3 and is intermittently tachycardic but in a sinus rhythm. She has no other associated symptoms. Labs ordered in triage had been unremarkable. Urine shows trace leukocytes and rare bacteria but also many squamous cells. No obvious sign of infection. She is not pregnant. We'll check chest x-ray, flu swab. We'll encourage oral fluids and give Tylenol and reassess. Suspect possible viral illness causing her symptoms. At this time she is relatively immunocompetent and has not missed any doses of her HIV medications.  ED PROGRESS: Patient is not orthostatic. Pregnancy test negative. Chest x-ray clear. I feel she is safe to be discharged home with outpatient follow-up. Discussed alternating Tylenol and Motrin at home for fever, pain. Discussed return precautions. Interpreter use. She verbalized understanding and is comfortable with this plan.  At this time, I do not feel there is any life-threatening condition present. I have reviewed and discussed all results (EKG, imaging, lab, urine as appropriate) and exam findings with patient/family. I have reviewed nursing notes and appropriate previous records.  I feel the patient is safe to be discharged home without further emergent workup and can continue workup as an outpatient as needed. Discussed usual and customary return precautions. Patient/family verbalize understanding and are comfortable with this plan.  Outpatient follow-up has been provided. All questions have been answered.     EKG Interpretation  Date/Time:  Thursday April 16 2016 23:59:29 EST Ventricular Rate:  80 PR Interval:    QRS Duration: 84 QT Interval:  365 QTC Calculation: 421 R  Axis:   54 Text Interpretation:  Sinus arrhythmia Rate is slower than previous EKG Confirmed by Yardley Lekas,  DO, Arayla Kruschke ([id number]) on 04/17/2016 12:16:01 AM        I personally performed the services described in this documentation, which was scribed in my presence. The recorded information has been reviewed and is accurate.     Marie MawKristen N Phoenyx Melka, DO 04/17/16 [phone]

## 2016-04-17 ENCOUNTER — Emergency Department (HOSPITAL_COMMUNITY): Payer: Self-pay

## 2016-04-17 LAB — INFLUENZA PANEL BY PCR (TYPE A & B)
INFLAPCR: NEGATIVE
Influenza B By PCR: NEGATIVE

## 2016-04-17 NOTE — Discharge Instructions (Signed)
You may alternate between Tylenol 1000 mg every 6 hours as needed for fever and pain and ibuprofen 800 mg every 8 hours as needed for fever and pain. Your labs, urine, chest x-ray today were normal. I suspect that you have a viral illness causing you to feel tired, weak and have a low-grade temperature. Please follow-up with your primary care physician if symptoms worsen. Ig you begin having severe headache, chest pain, shortness of breath, vomiting and cannot stop, blood in your stool, please return to the hospital.

## 2016-05-11 ENCOUNTER — Ambulatory Visit: Payer: Medicaid Other

## 2016-05-11 ENCOUNTER — Other Ambulatory Visit: Payer: Medicaid Other

## 2016-05-13 ENCOUNTER — Other Ambulatory Visit (INDEPENDENT_AMBULATORY_CARE_PROVIDER_SITE_OTHER): Payer: Self-pay

## 2016-05-13 ENCOUNTER — Ambulatory Visit: Payer: Self-pay

## 2016-05-13 DIAGNOSIS — B20 Human immunodeficiency virus [HIV] disease: Secondary | ICD-10-CM

## 2016-05-13 DIAGNOSIS — Z113 Encounter for screening for infections with a predominantly sexual mode of transmission: Secondary | ICD-10-CM

## 2016-05-13 LAB — CBC WITH DIFFERENTIAL/PLATELET
Basophils Absolute: 48 cells/uL (ref 0–200)
Basophils Relative: 1 %
EOS ABS: 720 {cells}/uL — AB (ref 15–500)
Eosinophils Relative: 15 %
HEMATOCRIT: 35.3 % (ref 35.0–45.0)
Hemoglobin: 11.9 g/dL (ref 11.7–15.5)
Lymphocytes Relative: 36 %
Lymphs Abs: 1728 cells/uL (ref 850–3900)
MCH: 29.4 pg (ref 27.0–33.0)
MCHC: 33.7 g/dL (ref 32.0–36.0)
MCV: 87.2 fL (ref 80.0–100.0)
MONO ABS: 480 {cells}/uL (ref 200–950)
MPV: 9.6 fL (ref 7.5–12.5)
Monocytes Relative: 10 %
NEUTROS PCT: 38 %
Neutro Abs: 1824 cells/uL (ref 1500–7800)
Platelets: 283 10*3/uL (ref 140–400)
RBC: 4.05 MIL/uL (ref 3.80–5.10)
RDW: 15.9 % — AB (ref 11.0–15.0)
WBC: 4.8 10*3/uL (ref 3.8–10.8)

## 2016-05-13 LAB — COMPLETE METABOLIC PANEL WITH GFR
ALT: 7 U/L (ref 6–29)
AST: 20 U/L (ref 10–30)
Albumin: 4.2 g/dL (ref 3.6–5.1)
Alkaline Phosphatase: 48 U/L (ref 33–115)
BUN: 13 mg/dL (ref 7–25)
CALCIUM: 9.2 mg/dL (ref 8.6–10.2)
CHLORIDE: 104 mmol/L (ref 98–110)
CO2: 18 mmol/L — ABNORMAL LOW (ref 20–31)
Creat: 0.86 mg/dL (ref 0.50–1.10)
GFR, Est Non African American: >89 mL/min (ref >=60)
Glucose, Bld: 106 mg/dL — ABNORMAL HIGH (ref 65–99)
POTASSIUM: 3.9 mmol/L (ref 3.5–5.3)
Sodium: 135 mmol/L (ref 135–146)
Total Bilirubin: 0.5 mg/dL (ref 0.2–1.2)
Total Protein: 8.5 g/dL — ABNORMAL HIGH (ref 6.1–8.1)

## 2016-05-14 LAB — T-HELPER CELL (CD4) - (RCID CLINIC ONLY)
CD4 % Helper T Cell: 18 % — ABNORMAL LOW (ref 33–55)
CD4 T CELL ABS: 350 /uL — AB (ref 400–2700)

## 2016-05-14 LAB — RPR

## 2016-05-26 LAB — HIV-1 RNA,QN PCR W/REFLEX GENOTYPE: HIV-1 RNA, QN PCR: <1.3 Log cps/mL (ref <1.30)

## 2016-05-28 ENCOUNTER — Other Ambulatory Visit: Payer: Self-pay | Admitting: *Deleted

## 2016-05-28 MED ORDER — ENSURE PO LIQD: 237.0000 mL | Freq: Two times a day (BID) | ORAL | 5 refills | Status: DC

## 2016-06-17 IMAGING — US US OB COMP LESS 14 WK
1 series · 15 of 28 positions shown · non-contrast
Comparison: None.

CLINICAL DATA: Vaginal bleeding and first-trimester pregnancy.

EXAM:
OBSTETRIC <14 WK ULTRASOUND
TECHNIQUE: Transabdominal ultrasound was performed for evaluation of the
gestation as well as the maternal uterus and adnexal regions.

[Series 1: us ob comp less 14 wk · 45 acquisitions, 15 frames shown]
[im 1/45]
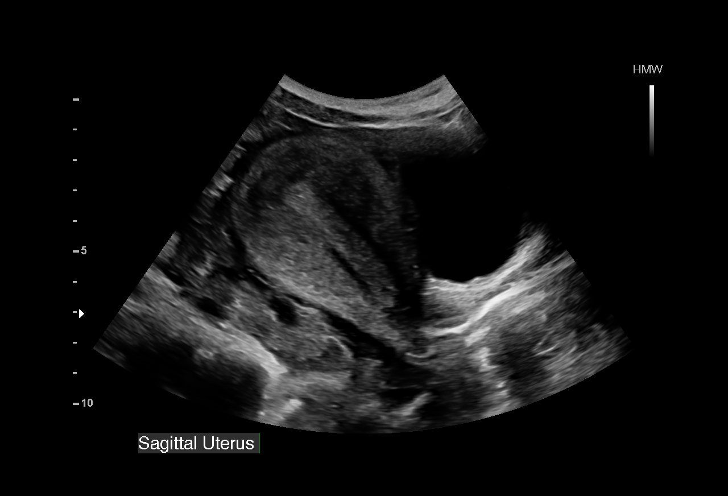
[im 4/45]
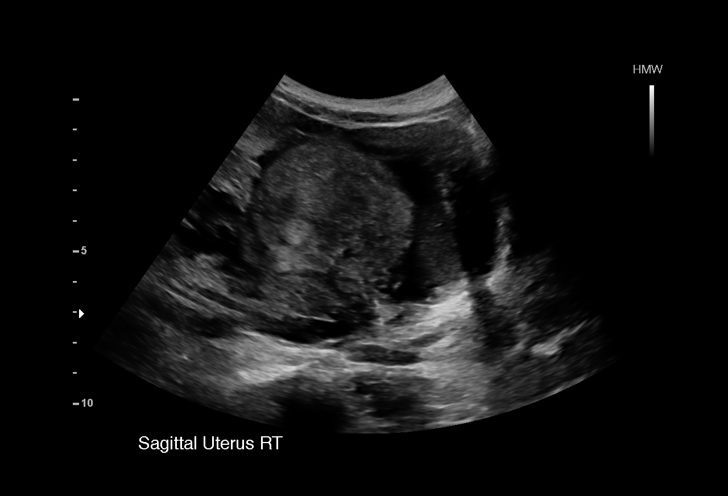
[im 7/45]
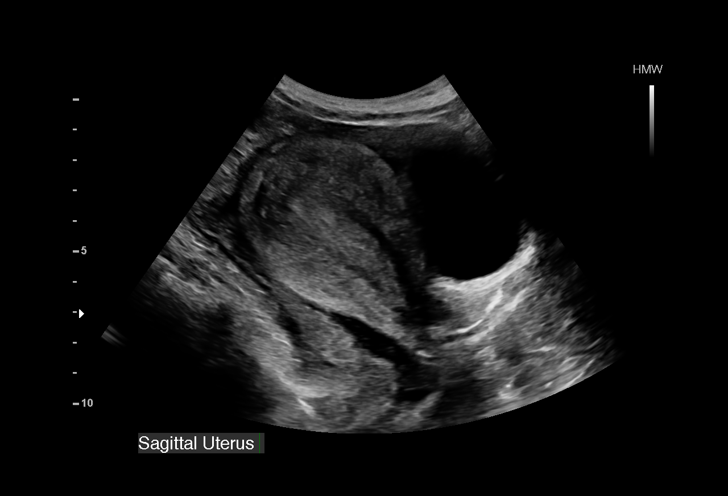
[im 10/45]
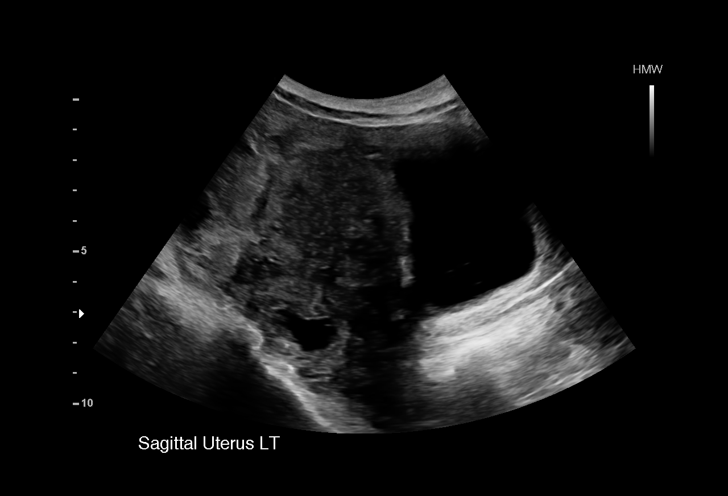
[im 14/45]
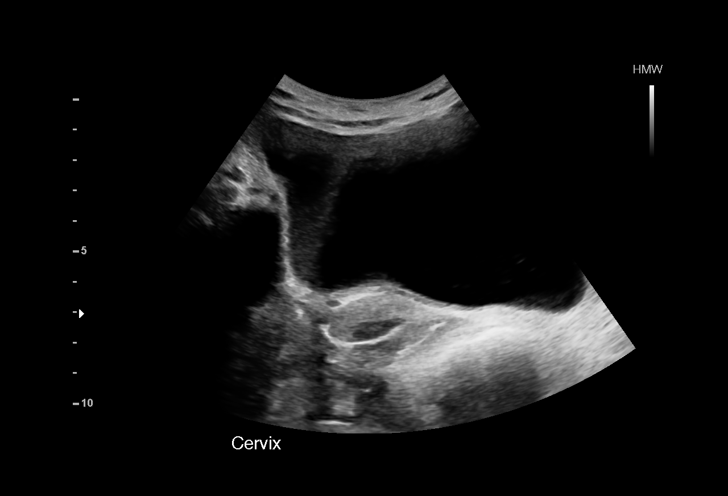
[im 17/45]
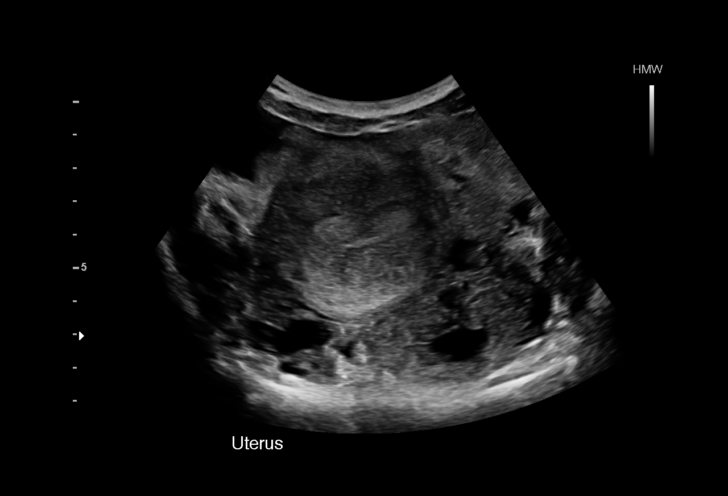
[im 20/45]
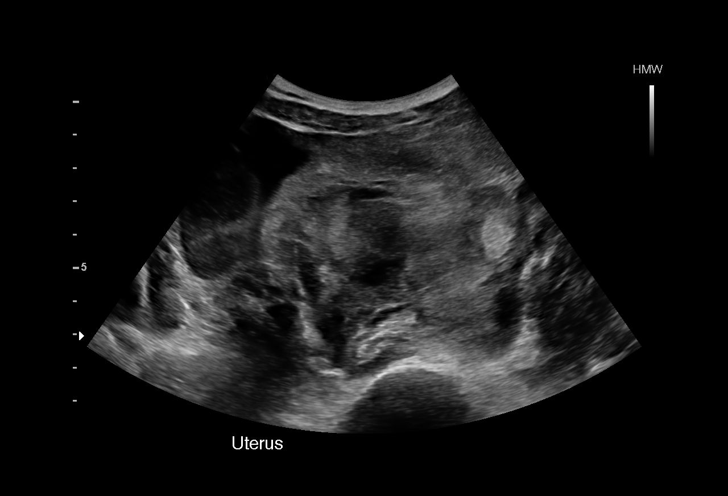
[im 23/45]
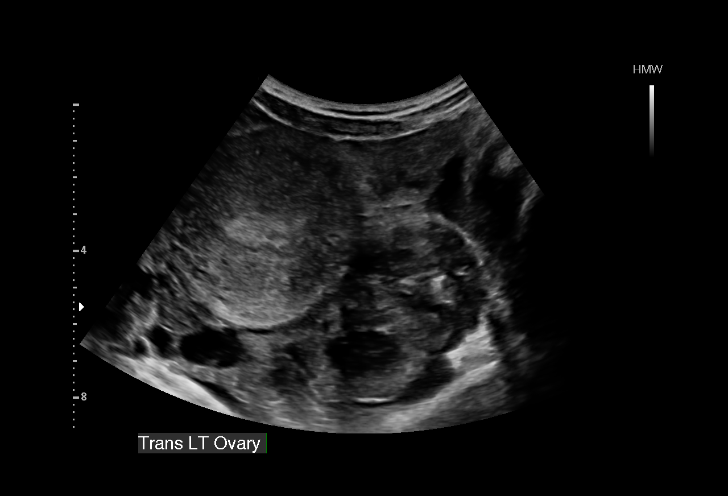
[im 25/45]
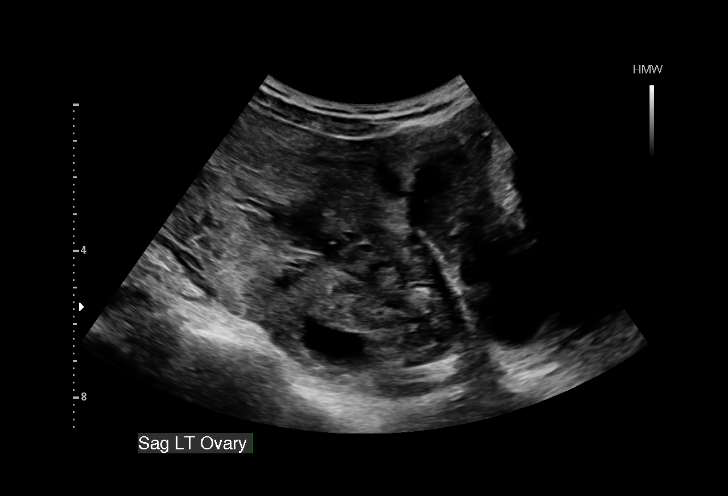
[im 28/45]
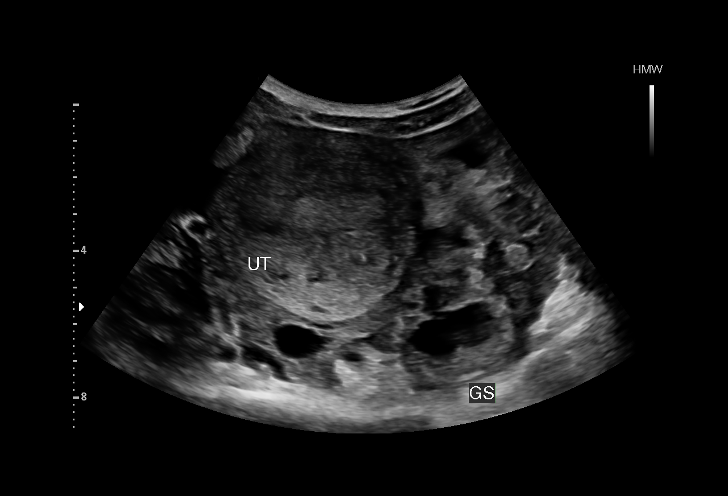
[im 31/45]
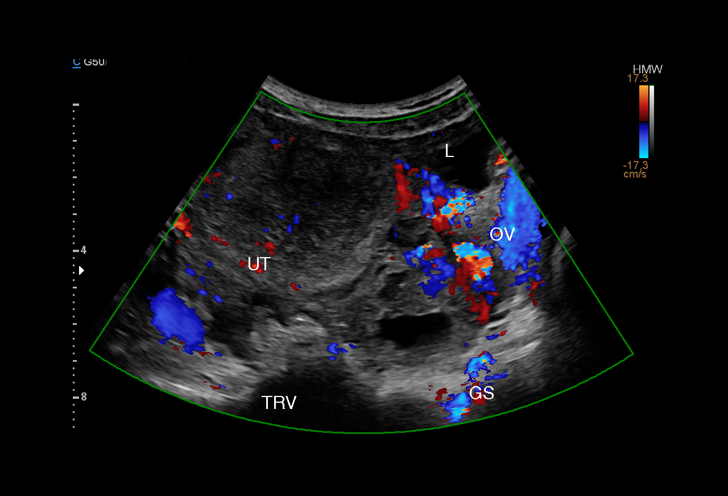
[im 35/45]
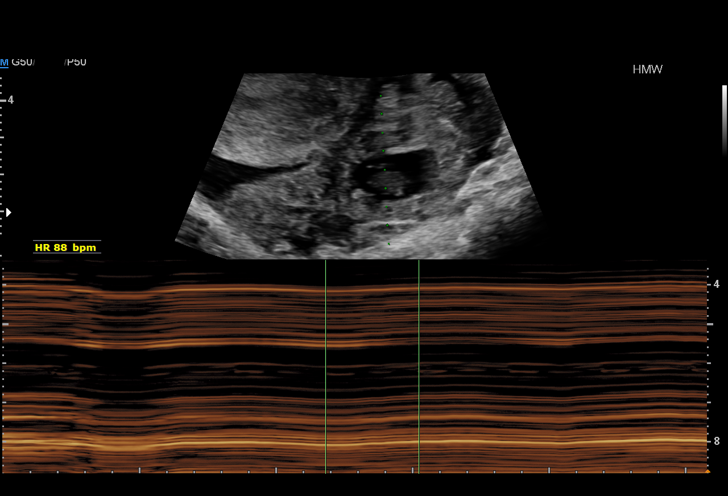
[im 38/45]
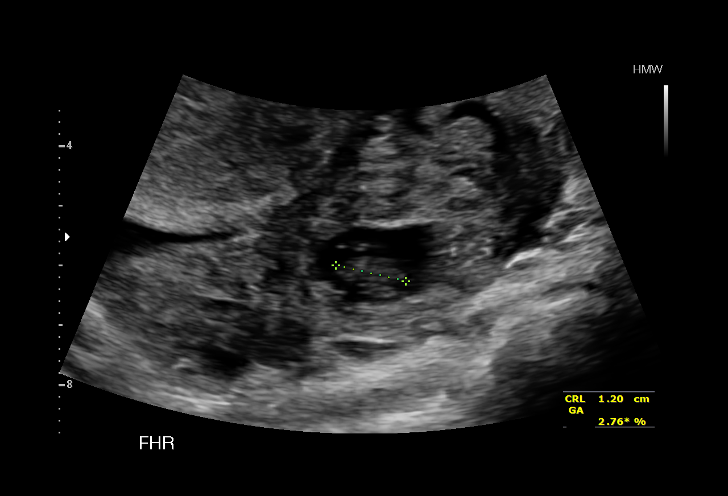
[im 41/45]
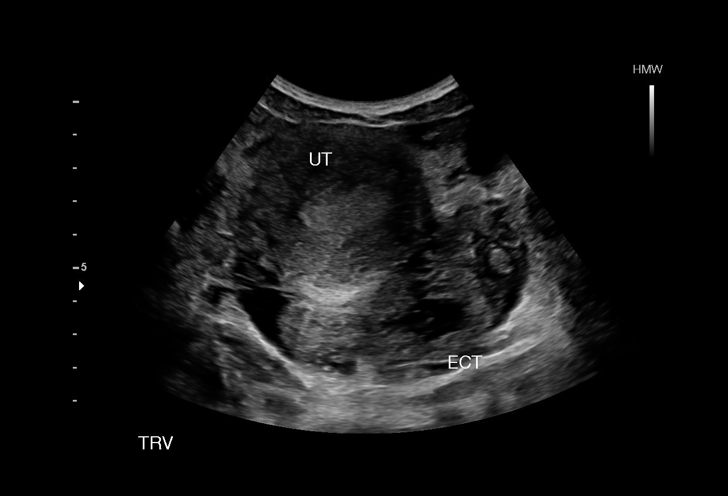
[im 45/45]
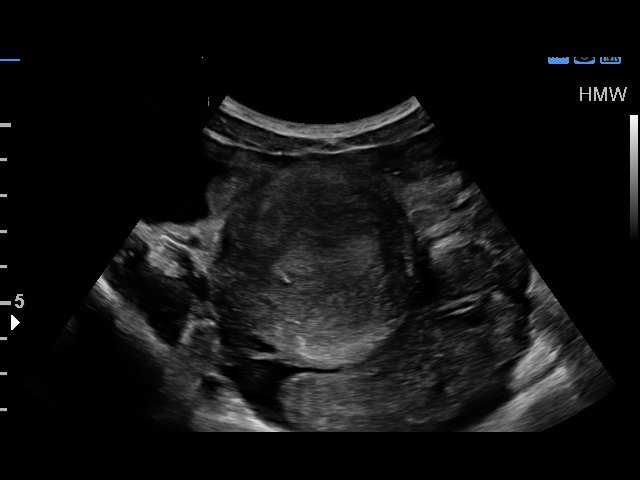

[15 of 28 positions shown; findings below may reference images not displayed]

FINDINGS: Gestational sac with 11.5 mm crown-rump length fetus in a left
adnexa. There is cardiac activity within a 88 beat per minute rate.
There is a large volume of adherent clot and particulate fluid in
the pelvis. No gestational sac within the uterus. As permitted by
adherent clot, the ovaries are negative.

Critical Value/emergent results were called by telephone at the time
of interpretation on 06/03/2015 at [DATE] to Dr. Dabbetul , who
verbally acknowledged these results.
IMPRESSION: Living left adnexal ectopic pregnancy with hemoperitoneum from
rupture/leakage.

## 2016-06-22 ENCOUNTER — Other Ambulatory Visit: Payer: Medicaid Other

## 2016-06-22 ENCOUNTER — Ambulatory Visit: Payer: Medicaid Other | Admitting: Infectious Disease

## 2016-06-27 ENCOUNTER — Inpatient Hospital Stay (HOSPITAL_COMMUNITY)
Admission: AD | Admit: 2016-06-27 | Discharge: 2016-06-27 | Disposition: A | Discharge status: Home / Self Care | Payer: Self-pay | Source: Ambulatory Visit | Attending: Obstetrics and Gynecology | Admitting: Obstetrics and Gynecology

## 2016-06-27 ENCOUNTER — Encounter (HOSPITAL_COMMUNITY): Payer: Self-pay | Admitting: *Deleted

## 2016-06-27 DIAGNOSIS — Z3202 Encounter for pregnancy test, result negative: Secondary | ICD-10-CM | POA: Insufficient documentation

## 2016-06-27 LAB — URINALYSIS, ROUTINE W REFLEX MICROSCOPIC
Bilirubin Urine: NEGATIVE
GLUCOSE, UA: NEGATIVE mg/dL
HGB URINE DIPSTICK: NEGATIVE
Ketones, ur: NEGATIVE mg/dL
NITRITE: NEGATIVE
PH: 5 (ref 5.0–8.0)
PROTEIN: NEGATIVE mg/dL
SPECIFIC GRAVITY, URINE: 1.02 (ref 1.005–1.030)

## 2016-06-27 LAB — POCT PREGNANCY, URINE: Preg Test, Ur: NEGATIVE

## 2016-06-27 NOTE — Discharge Instructions (Signed)
Pregnancy Test Information °WHAT IS A PREGNANCY TEST? °A pregnancy test is used to detect the presence of human chorionic gonadotropin (hCG) in a sample of your urine or blood. hCG is a hormone produced by the cells of the placenta. The placenta is the organ that forms to nourish and support a developing baby. °This test requires a sample of either blood or urine. A pregnancy test determines whether you are pregnant or not. °HOW ARE PREGNANCY TESTS DONE? °Pregnancy tests are done using a home pregnancy test or having a blood or urine test done at your health care provider's office. °Home pregnancy tests require a urine sample. °· Most kits use a plastic testing device with a strip of paper that indicates whether there is hCG in your urine. °· Follow the test instructions very carefully. °· After you urinate on the test stick, markings will appear to let you know whether you are pregnant. °· For best results, use your first urine of the morning. That is when the concentration of hCG is highest. °Having a blood test to check for pregnancy requires a sample of blood drawn from a vein in your hand or arm. Your health care provider will send your sample to a lab for testing. Results of a pregnancy test will be positive or negative. °IS ONE TYPE OF PREGNANCY TEST BETTER THAN ANOTHER? °In some cases, a blood test will return a positive result even if a urine test was negative because blood tests are more sensitive. This means blood tests can detect hCG earlier than home pregnancy tests. °HOW ACCURATE ARE HOME PREGNANCY TESTS? °Both types of pregnancy tests are very accurate. °· A blood test is about 98% accurate. °· When you are far enough along in your pregnancy and when used correctly, home pregnancy tests are equally accurate. °CAN ANYTHING INTERFERE WITH HOME PREGNANCY TEST RESULTS? °It is possible for certain conditions to cause an inaccurate test result (false positive or false negative). °· A false positive is a  positive test result when you are not pregnant. This can happen if you: °¨ Are taking certain medicines, including anticonvulsants or tranquilizers. °¨ Have certain proteins in your blood. °· A false negative is a negative test result when you are pregnant. This can happen if you: °¨ Took the test before there was enough hCG to detect. A pregnancy test will not be positive in most women until 3-4 weeks after conception. °¨ Drank a lot of liquid before the test. Diluted urine samples can sometimes give an inaccurate result. °¨ Take certain medicines, such as water pills (diuretics) or some antihistamines. °WHAT SHOULD I DO IF I HAVE A POSITIVE PREGNANCY TEST? °If you have a positive pregnancy test, schedule an appointment with your health care provider. You might need additional testing to confirm the pregnancy. In the meantime, begin taking a prenatal vitamin, stop smoking, stop drinking alcohol, and do not use street drugs. °Talk to your health care provider about how to take care of yourself during your pregnancy. Ask about what to expect from the care you will need throughout pregnancy (prenatal care). °This information is not intended to replace advice given to you by your health care provider. Make sure you discuss any questions you have with your health care provider. °Document Released: 04/23/2003 Document Revised: 12/25/2015 Document Reviewed: 08/15/2013 °Elsevier Interactive Patient Education © 2017 [employer] Inc[employer] ° °

## 2016-06-27 NOTE — MAU Provider Note (Signed)
S:  Swahili interpretor used:   Ms.Brettney Pola CornKashindi is a 26 y.o. female [phone] here in [facility]U for a pregnancy test. She is feeling her baby move in her abdomen. She had a normal menstrual cycle on Friday. She had no other complaints.    O:  GENERAL: Well-developed, well-nourished female in no acute distress.  LUNGS: Effort normal SKIN: Warm, dry and without erythema PSYCH: Normal mood and affect  Vitals:   06/27/16 1107 06/27/16 1130  BP: 149/89 142/86  Pulse: 84 84  Resp: 16 18  Temp: 98 F (36.7 C)   Weight: 97 lb (44 kg)   Height: 5' (1.524 m)     Results for orders placed or performed during the hospital encounter of 06/27/16 (from the past 48 hour(s))  Urinalysis, Routine w reflex microscopic     Status: Abnormal   Collection Time: 06/27/16 11:00 AM  Result Value Ref Range   Color, Urine YELLOW YELLOW   APPearance HAZY (A) CLEAR   Specific Gravity, Urine 1.020 1.005 - 1.030   pH 5.0 5.0 - 8.0   Glucose, UA NEGATIVE NEGATIVE mg/dL   Hgb urine dipstick NEGATIVE NEGATIVE   Bilirubin Urine NEGATIVE NEGATIVE   Ketones, ur NEGATIVE NEGATIVE mg/dL   Protein, ur NEGATIVE NEGATIVE mg/dL   Nitrite NEGATIVE NEGATIVE   Leukocytes, UA SMALL (A) NEGATIVE   RBC / HPF 0-5 0 - 5 RBC/hpf   WBC, UA 6-30 0 - 5 WBC/hpf   Bacteria, UA RARE (A) NONE SEEN   Squamous Epithelial / LPF 0-5 (A) NONE SEEN   Mucous PRESENT    Hyaline Casts, UA PRESENT   Pregnancy, urine POC     Status: None   Collection Time: 06/27/16 11:06 AM  Result Value Ref Range   Preg Test, Ur NEGATIVE NEGATIVE    Comment:        THE SENSITIVITY OF THIS METHODOLOGY IS >24 mIU/mL     A:  1. Encounter for pregnancy test, result negative     P:  Discharge home in stable condition Discussed reasons for return Follow up with PCP   Duane LopeJennifer I Wafa Martes, NP 06/27/2016 6:38 PM

## 2016-06-27 NOTE — [facility]U Note (Signed)
Pt presents to MAU stating that she needs to be evaluated because she is pregnant. Denies pain or VB, states she feels baby moving

## 2016-07-01 ENCOUNTER — Ambulatory Visit: Payer: Self-pay

## 2016-08-26 ENCOUNTER — Ambulatory Visit: Payer: Self-pay | Admitting: Infectious Disease

## 2017-01-13 ENCOUNTER — Ambulatory Visit: Payer: Self-pay | Admitting: Infectious Disease

## 2017-01-20 ENCOUNTER — Telehealth: Payer: Self-pay | Admitting: Pharmacist

## 2017-01-20 ENCOUNTER — Ambulatory Visit (INDEPENDENT_AMBULATORY_CARE_PROVIDER_SITE_OTHER): Payer: Self-pay | Admitting: Infectious Disease

## 2017-01-20 ENCOUNTER — Encounter: Payer: Self-pay | Admitting: Infectious Disease

## 2017-01-20 VITALS — BP 144/83 | HR 97 | Temp 98.2°F | Wt 94.0 lb

## 2017-01-20 DIAGNOSIS — Z91199 Patient's noncompliance with other medical treatment and regimen due to unspecified reason: Secondary | ICD-10-CM

## 2017-01-20 DIAGNOSIS — Z23 Encounter for immunization: Secondary | ICD-10-CM

## 2017-01-20 DIAGNOSIS — Z789 Other specified health status: Secondary | ICD-10-CM

## 2017-01-20 DIAGNOSIS — B20 Human immunodeficiency virus [HIV] disease: Secondary | ICD-10-CM

## 2017-01-20 DIAGNOSIS — Z9119 Patient's noncompliance with other medical treatment and regimen: Secondary | ICD-10-CM

## 2017-01-20 DIAGNOSIS — Z709 Sex counseling, unspecified: Secondary | ICD-10-CM

## 2017-01-20 DIAGNOSIS — Z3169 Encounter for other general counseling and advice on procreation: Secondary | ICD-10-CM

## 2017-01-20 MED ORDER — EMTRICITABINE-TENOFOVIR AF 200-25 MG PO TABS: 1.0000 | ORAL_TABLET | Freq: Every day | ORAL | 11 refills | Status: DC

## 2017-01-20 MED ORDER — DARUNAVIR-COBICISTAT 800-150 MG PO TABS: 1.0000 | ORAL_TABLET | Freq: Every day | ORAL | 11 refills | Status: DC

## 2017-01-20 MED ORDER — DARUN-COBIC-EMTRICIT-TENOFAF 800-150-200-10 MG PO TABS: 1.0000 | ORAL_TABLET | Freq: Every day | ORAL | 5 refills | Status: DC

## 2017-01-20 MED FILL — SYMTUZA 800-150-200-10 MG T: 800-150-200 | 30 days supply | Qty: 30 | Fill #0

## 2017-01-20 NOTE — Telephone Encounter (Signed)
Activated a 30 day advancing access card for [employer] for patient while she waits ADAP approval. Medication sent to [facility].

## 2017-01-20 NOTE — Progress Notes (Signed)
Chief complaint:  Here for follow-up with her husband. Both of them have failed to renew their ADAP since September of 2017 meaning she should've been without medications from April 2018 to present date.  Subjective:    Patient ID: Marie Olson, female    DOB: [birth year], 26 y.o.   MRN: [id number]  HPI  26year old Swahili speaking African lady with newly diagnosed HIV, here with HIV + husband . She had fairly high viral load and CD4 just above 200 when we first checked it.  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   We tried Genvoya for her but due to her terrible compliance changed her over to PI based therapy.  And she continue to showPoor adherence with this regimen leading me to change her over to [facility] and DESCOVY and she responded dramatically was free happy with the new regimen. She also became undetectable and her CD4 count improved.   As above since we last saw her she has been off medications since April.  She and her husband are still trying to have another child which would be something reasonable to attempt if they were on medications but being off medications is interested the health of both Coumadin and any potential child and I can see. She and he are ready have an HIV-positive child which they bring to [facility]. She claims as a reason why they've not been able to make appointments due to conflicts with the child's appointments. I told her that she does not necessarily need to have a point with me but it is absolutely critical that she be seen for renewal of her pair source namely ADAP.   Now that we have new data with regards t DTG and potential neural due tube defects we need to change her off of aDTG based regimen  And I will go to [facility] based regimen     Lab Results  Component Value Date   HIV1RNAQUANT <20 03/11/2016   HIV1RNAQUANT 5,790 (H) 02/24/2016   HIV1RNAQUANT 31,679 (H) 01/08/2016     Lab Results    Component Value Date   CD4TABS 350 (L) 05/13/2016   CD4TABS 370 (L) 03/11/2016   CD4TABS 240 (L) 02/24/2016  n.    Past Medical History:  Diagnosis Date  . Abscess   . 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 Dysphagia 04/08/2016  . HIV (human immunodeficiency virus infection) (HCC)   . Housing problems 12/24/2014  . Hypertension   . Symptoms of upper respiratory infection (URI) 04/08/2016    Past Surgical History:  Procedure Laterality Date  . DIAGNOSTIC LAPAROSCOPY WITH REMOVAL OF ECTOPIC PREGNANCY N/A 06/03/2015   Procedure: DIAGNOSTIC LAPAROSCOPY,  Left Salpingectomy with  REMOVAL OF ECTOPIC PREGNANCY;  Surgeon: Adam Phenix, MD;  Location: [facility];  Service: Gynecology;  Laterality: N/A;    Family History  Problem Relation Age of Onset  . Family history unknown: Yes      Social History   Social History  . Marital status: Married    Spouse name: N/A  . Number of children: N/A  . Years of education: N/A   Social History Main Topics  . Smoking status: Never Smoker  . Smokeless tobacco: Never Used  . Alcohol use No  . Drug use: No  . Sexual activity: Yes    Partners: Male    Birth control/ protection: None     Comment: pt declined  condoms   Other Topics Concern  . None   Social History Narrative  . None    No Known Allergies   Current Outpatient Prescriptions:  .  darunavir-cobicistat (PREZCOBIX) 800-150 MG tablet, Take 1 tablet by mouth daily., Disp: 30 tablet, Rfl: 11 .  emtricitabine-tenofovir AF (DESCOVY) 200-25 MG tablet, Take 1 tablet by mouth daily., Disp: 30 tablet, Rfl: 11 .  ENSURE (ENSURE), Take 237 mLs by mouth 2 (two) times daily between meals. (Patient not taking: Reported on 01/20/2017), Disp: 237 mL, Rfl: 5 .  mirtazapine (REMERON) 7.5 MG tablet, Take 1 tablet (7.5 mg total) by mouth at bedtime. (Patient not taking: Reported on 01/20/2017), Disp: 30 tablet, Rfl: 5 .  ondansetron (ZOFRAN) 4 MG tablet,  Take 1 tablet (4 mg total) by mouth every 8 (eight) hours as needed for nausea or vomiting. (Patient not taking: Reported on 01/20/2017), Disp: 30 tablet, Rfl: 2 .  TRINESSA, 28, 0.18/0.215/0.25 MG-35 MCG tablet, Take 1 tablet by mouth daily., Disp: , Rfl: 11   Review of Systems  Constitutional: Negative for activity change, chills, diaphoresis, fatigue, fever and unexpected weight change.  HENT: Negative for congestion, rhinorrhea, sinus pressure, sneezing, sore throat and trouble swallowing.   Eyes: Negative for photophobia and visual disturbance.  Respiratory: Negative for cough, chest tightness, shortness of breath, wheezing and stridor.   Cardiovascular: Negative for chest pain, palpitations and leg swelling.  Gastrointestinal: Negative for abdominal distention, abdominal pain, anal bleeding, blood in stool, constipation and diarrhea.  Genitourinary: Negative for difficulty urinating, dysuria, flank pain and hematuria.  Musculoskeletal: Negative for arthralgias, back pain, gait problem, joint swelling and myalgias.  Skin: Negative for color change, pallor, rash and wound.  Neurological: Negative for tremors, weakness and light-headedness.  Hematological: Negative for adenopathy. Does not bruise/bleed easily.  Psychiatric/Behavioral: Negative for agitation, behavioral problems, confusion, decreased concentration, dysphoric mood and sleep disturbance.       Objective:   Physical Exam  Constitutional: She is oriented to person, place, and time. She appears well-nourished. No distress.  HENT:  Head: Normocephalic and atraumatic.  Mouth/Throat: Uvula is midline and oropharynx is clear and moist. No oropharyngeal exudate.  Patient holding his mouth  Eyes: Conjunctivae and EOM are normal. No scleral icterus.  Neck: Normal range of motion. Neck supple.  Cardiovascular: Normal rate and regular rhythm.   Pulmonary/Chest: Effort normal. No respiratory distress. She has no wheezes.  Abdominal:  She exhibits no distension.  Musculoskeletal: She exhibits no edema.  Neurological: She is alert and oriented to person, place, and time. She exhibits normal muscle tone. Coordination normal.  Skin: Skin is warm and dry. No rash noted. She is not diaphoretic. No erythema. No pallor.  Psychiatric: She has a normal mood and affect. Her behavior is normal. Judgment and thought content normal.          Assessment & Plan:   HIV/AIDS: I'm very frustrated by the fact that neither of them has come in for renewal of  ADAP.   We will have her fill one month of SYMTUZA at [facility] and then she will go with ADAP with PREZCOBIX and DEscovy (until [facility] is approved)   Chronic hepatitis B without delta agent and without hepatic coma: To be on covered with TAF/FTC   I spent easily over  40 minutes with the patient including greater than 50% of time in face to face counsel of the patient using IPad and telephonic Swahili translators  regarding the absolute critical importance of  her taking her antiretroviral medications and the need to prevent mother to child transmission since that should be in never event. I also counseled her extensively with regards to new data that came out on DTG and neural tube defects and why we need to change her to a non DTG based regimen. I explained to both she and her husband how to get to the [facility] outpatient pharmacy and demonstrated to them what the exterior appearance of the pharmacy look like through my Internet Browser on my phone. I gave the  address phone number and the bus line to get there. It is critical that they both come back and thought the remaining. I also worked extensively as part of this time in coordinating her care with [facility], [facility] and Case Management.

## 2017-01-21 LAB — COMPLETE METABOLIC PANEL WITH GFR
AG RATIO: 0.9 (calc) — AB (ref 1.0–2.5)
ALT: 11 U/L (ref 6–29)
AST: 28 U/L (ref 10–30)
Albumin: 4.4 g/dL (ref 3.6–5.1)
Alkaline phosphatase (APISO): 59 U/L (ref 33–115)
BUN: 16 mg/dL (ref 7–25)
CALCIUM: 9.2 mg/dL (ref 8.6–10.2)
CO2: 19 mmol/L — ABNORMAL LOW (ref 20–32)
Chloride: 105 mmol/L (ref 98–110)
Creat: 0.72 mg/dL (ref 0.50–1.10)
GFR, EST NON AFRICAN AMERICAN: 114 mL/min/{1.73_m2} (ref >=60)
GFR, Est African American: 132 mL/min/{1.73_m2} (ref >=60)
GLOBULIN: 4.8 g/dL — AB (ref 1.9–3.7)
Glucose, Bld: 78 mg/dL (ref 65–99)
Potassium: 3.9 mmol/L (ref 3.5–5.3)
Sodium: 136 mmol/L (ref 135–146)
Total Bilirubin: 0.3 mg/dL (ref 0.2–1.2)
Total Protein: 9.2 g/dL — ABNORMAL HIGH (ref 6.1–8.1)

## 2017-01-21 LAB — T-HELPER CELL (CD4) - (RCID CLINIC ONLY)
CD4 % Helper T Cell: 20 % — ABNORMAL LOW (ref 33–55)
CD4 T Cell Abs: 210 /uL — ABNORMAL LOW (ref 400–2700)

## 2017-01-21 LAB — CBC WITH DIFFERENTIAL/PLATELET
BASOS PCT: 1.3 %
Basophils Absolute: 40 cells/uL (ref 0–200)
EOS ABS: 580 {cells}/uL — AB (ref 15–500)
Eosinophils Relative: 18.7 %
HCT: 35.6 % (ref 35.0–45.0)
Hemoglobin: 12 g/dL (ref 11.7–15.5)
Lymphs Abs: 899 cells/uL (ref 850–3900)
MCH: 29.1 pg (ref 27.0–33.0)
MCHC: 33.7 g/dL (ref 32.0–36.0)
MCV: 86.2 fL (ref 80.0–100.0)
MONOS PCT: 7.1 %
MPV: 10.3 fL (ref 7.5–12.5)
Neutro Abs: 1361 cells/uL — ABNORMAL LOW (ref 1500–7800)
Neutrophils Relative %: 43.9 %
PLATELETS: 303 10*3/uL (ref 140–400)
RBC: 4.13 10*6/uL (ref 3.80–5.10)
RDW: 13.8 % (ref 11.0–15.0)
TOTAL LYMPHOCYTE: 29 %
WBC mixed population: 220 cells/uL (ref 200–950)
WBC: 3.1 10*3/uL — ABNORMAL LOW (ref 3.8–10.8)

## 2017-01-21 LAB — HCG, QUANTITATIVE, PREGNANCY

## 2017-01-21 LAB — RPR: RPR Ser Ql: NONREACTIVE

## 2017-01-29 ENCOUNTER — Encounter: Payer: Self-pay | Admitting: Infectious Disease

## 2017-02-13 LAB — HIV-1 GENOTYPE: HIV-1 Genotype: DETECTED — AB

## 2017-02-13 LAB — HIV-1 INTEGRASE GENOTYPE

## 2017-02-13 LAB — HIV RNA, RTPCR W/R GT (RTI, PI,INT)
HIV 1 RNA QUANT: 110000 {copies}/mL — AB
HIV-1 RNA QUANT, LOG: 5.04 {Log_copies}/mL — AB

## 2017-02-17 ENCOUNTER — Other Ambulatory Visit: Payer: Self-pay

## 2017-02-17 DIAGNOSIS — B20 Human immunodeficiency virus [HIV] disease: Secondary | ICD-10-CM

## 2017-02-17 LAB — CBC WITH DIFFERENTIAL/PLATELET
Basophils Absolute: 29 cells/uL (ref 0–200)
Basophils Relative: 0.7 %
EOS ABS: 750 {cells}/uL — AB (ref 15–500)
Eosinophils Relative: 18.3 %
HEMATOCRIT: 34.1 % — AB (ref 35.0–45.0)
Hemoglobin: 11.4 g/dL — ABNORMAL LOW (ref 11.7–15.5)
LYMPHS ABS: 1611 {cells}/uL (ref 850–3900)
MCH: 28.9 pg (ref 27.0–33.0)
MCHC: 33.4 g/dL (ref 32.0–36.0)
MCV: 86.5 fL (ref 80.0–100.0)
MPV: 10.7 fL (ref 7.5–12.5)
Monocytes Relative: 8.5 %
NEUTROS PCT: 33.2 %
Neutro Abs: 1361 cells/uL — ABNORMAL LOW (ref 1500–7800)
Platelets: 263 10*3/uL (ref 140–400)
RBC: 3.94 10*6/uL (ref 3.80–5.10)
RDW: 14 % (ref 11.0–15.0)
Total Lymphocyte: 39.3 %
WBC: 4.1 10*3/uL (ref 3.8–10.8)
WBCMIX: 349 {cells}/uL (ref 200–950)

## 2017-02-17 LAB — COMPLETE METABOLIC PANEL WITH GFR
AG RATIO: 0.9 (calc) — AB (ref 1.0–2.5)
ALT: 9 U/L (ref 6–29)
AST: 27 U/L (ref 10–30)
Albumin: 4.2 g/dL (ref 3.6–5.1)
Alkaline phosphatase (APISO): 54 U/L (ref 33–115)
BUN: 12 mg/dL (ref 7–25)
CALCIUM: 9.1 mg/dL (ref 8.6–10.2)
CO2: 24 mmol/L (ref 20–32)
Chloride: 103 mmol/L (ref 98–110)
Creat: 0.75 mg/dL (ref 0.50–1.10)
GFR, EST AFRICAN AMERICAN: 126 mL/min/{1.73_m2} (ref >=60)
GFR, EST NON AFRICAN AMERICAN: 108 mL/min/{1.73_m2} (ref >=60)
GLOBULIN: 4.8 g/dL — AB (ref 1.9–3.7)
Glucose, Bld: 87 mg/dL (ref 65–99)
POTASSIUM: 3.4 mmol/L — AB (ref 3.5–5.3)
Sodium: 136 mmol/L (ref 135–146)
Total Bilirubin: 0.5 mg/dL (ref 0.2–1.2)
Total Protein: 9 g/dL — ABNORMAL HIGH (ref 6.1–8.1)

## 2017-02-18 LAB — T-HELPER CELL (CD4) - (RCID CLINIC ONLY)
CD4 % Helper T Cell: 14 % — ABNORMAL LOW (ref 33–55)
CD4 T CELL ABS: 260 /uL — AB (ref 400–2700)

## 2017-03-02 LAB — HIV-1 RNA ULTRAQUANT REFLEX TO GENTYP+
HIV 1 RNA QUANT: 56000 {copies}/mL — AB
HIV-1 RNA Quant, Log: 4.75 Log cps/mL — ABNORMAL HIGH

## 2017-03-02 LAB — RFLX HIV-1 INTEGRASE GENOTYPE: HIV-1 Genotype: DETECTED — AB

## 2017-03-03 ENCOUNTER — Encounter: Payer: Self-pay | Admitting: Infectious Disease

## 2017-03-03 ENCOUNTER — Ambulatory Visit (INDEPENDENT_AMBULATORY_CARE_PROVIDER_SITE_OTHER): Payer: Self-pay | Admitting: Infectious Disease

## 2017-03-03 ENCOUNTER — Other Ambulatory Visit: Payer: Self-pay | Admitting: Pharmacist

## 2017-03-03 VITALS — BP 120/78 | HR 90 | Temp 98.8°F | Wt 94.0 lb

## 2017-03-03 DIAGNOSIS — Z9119 Patient's noncompliance with other medical treatment and regimen: Secondary | ICD-10-CM

## 2017-03-03 DIAGNOSIS — Z91199 Patient's noncompliance with other medical treatment and regimen due to unspecified reason: Secondary | ICD-10-CM

## 2017-03-03 DIAGNOSIS — B181 Chronic viral hepatitis B without delta-agent: Secondary | ICD-10-CM

## 2017-03-03 DIAGNOSIS — Z23 Encounter for immunization: Secondary | ICD-10-CM

## 2017-03-03 DIAGNOSIS — B20 Human immunodeficiency virus [HIV] disease: Secondary | ICD-10-CM

## 2017-03-03 DIAGNOSIS — Z603 Acculturation difficulty: Secondary | ICD-10-CM

## 2017-03-03 MED ORDER — DARUNAVIR-COBICISTAT 800-150 MG PO TABS: 1.0000 | ORAL_TABLET | Freq: Every day | ORAL | 11 refills | Status: DC

## 2017-03-03 MED ORDER — BICTEGRAVIR-EMTRICITAB-TENOFOV 50-200-25 MG PO TABS: 1.0000 | ORAL_TABLET | Freq: Every day | ORAL | 11 refills | Status: DC

## 2017-03-03 MED ORDER — EMTRICITABINE-TENOFOVIR AF 200-25 MG PO TABS: 1.0000 | ORAL_TABLET | Freq: Every day | ORAL | 11 refills | Status: DC

## 2017-03-03 NOTE — Progress Notes (Signed)
Chief complaint:  Here for follow-up with her husband. She tells me she doesn't like the pills she was on before  Subjective:    Patient ID: Marie Olson, female    DOB: Feb 28, 1991, 28 y.o.   MRN: [id number]  HPI  26 year old Swahili speaking African lady with newly diagnosed HIV, here with HIV + husband . She had fairly high viral load and CD4 just above 200 when we first checked it.  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   We tried Genvoya for her but due to her terrible compliance changed her over to PI based therapy.  And she continue to show poor adherence with this regimen leading me to change her over to [facility] and DESCOVY and she responded dramatically was free happy with the new regimen. She also became undetectable and her CD4 count improved.   As above since we last saw her she has been off medications since April.  She and her husband are still trying to have another child which would be something reasonable to attempt if they were on medications but being off medications is interested the health of both Coumadin and any potential child and I can see. She and he are ready have an HIV-positive child which they bring to [facility]. She claims as a reason why they've not been able to make appointments due to conflicts with the child's appointments. I told her that she does not necessarily need to have a point with me but it is absolutely critical that she be seen for renewal of her pair source namely ADAP.   Now that we have new data with regards t DTG and potential neural due tube defects I wanted to have her off of a DTG regimen and reinstitute at PI based regimen and I sent SYMTUZA to [facility], which I thought she filled but now today she and her husband are telling me that only her husbands meds were filled.    Lab Results  Component Value Date   HIV1RNAQUANT 56,000 (H) 02/17/2017   HIV1RNAQUANT 110,000  (H) 01/20/2017   HIV1RNAQUANT <20 03/11/2016     Lab Results  Component Value Date   CD4TABS 260 (L) 02/17/2017   CD4TABS 210 (L) 01/20/2017   CD4TABS 350 (L) 05/13/2016    She is telling me that she dislikes the Prezcobix and Descovy which I had planned on switching her to today with ADAP and she wants a more tolerable regimen so I offered BIKTARVY to her---but the concern will be that BIC is similar ENOUGH TO DTG that there may be risk of Neural Tube Defects.     Past Medical History:  Diagnosis Date  . Abscess   . 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 Dysphagia 04/08/2016  . HIV (human immunodeficiency virus infection) (HCC)   . Housing problems 12/24/2014  . Hypertension   . Symptoms of upper respiratory infection (URI) 04/08/2016    Past Surgical History:  Procedure Laterality Date  . DIAGNOSTIC LAPAROSCOPY WITH REMOVAL OF ECTOPIC PREGNANCY N/A 06/03/2015   Procedure: DIAGNOSTIC LAPAROSCOPY,  Left Salpingectomy with  REMOVAL OF ECTOPIC PREGNANCY;  Surgeon: Adam Phenix, MD;  Location: [facility];  Service: Gynecology;  Laterality: N/A;    Family History  Problem Relation Age of Onset  . Family history unknown: Yes      Social History   Social History  . Marital status:  Married    Spouse name: N/A  . Number of children: N/A  . Years of education: N/A   Social History Main Topics  . Smoking status: Never Smoker  . Smokeless tobacco: Never Used  . Alcohol use No  . Drug use: No  . Sexual activity: Yes    Partners: Male    Birth control/ protection: None     Comment: pt declined condoms   Other Topics Concern  . None   Social History Narrative  . None    No Known Allergies   Current Outpatient Prescriptions:  .  Darunavir-Cobicisctat-Emtricitabine-Tenofovir Alafenamide (SYMTUZA) 800-150-200-10 MG TABS, Take 1 tablet by mouth daily with breakfast. (Patient not taking: Reported on 03/03/2017), Disp: 30 tablet, Rfl:  5 .  ENSURE (ENSURE), Take 237 mLs by mouth 2 (two) times daily between meals. (Patient not taking: Reported on 01/20/2017), Disp: 237 mL, Rfl: 5 .  mirtazapine (REMERON) 7.5 MG tablet, Take 1 tablet (7.5 mg total) by mouth at bedtime. (Patient not taking: Reported on 01/20/2017), Disp: 30 tablet, Rfl: 5 .  ondansetron (ZOFRAN) 4 MG tablet, Take 1 tablet (4 mg total) by mouth every 8 (eight) hours as needed for nausea or vomiting. (Patient not taking: Reported on 01/20/2017), Disp: 30 tablet, Rfl: 2 .  TRINESSA, 28, 0.18/0.215/0.25 MG-35 MCG tablet, Take 1 tablet by mouth daily., Disp: , Rfl: 11   Review of Systems  Constitutional: Negative for activity change, chills, diaphoresis, fatigue, fever and unexpected weight change.  HENT: Negative for congestion, rhinorrhea, sinus pressure, sneezing, sore throat and trouble swallowing.   Eyes: Negative for photophobia and visual disturbance.  Respiratory: Negative for cough, chest tightness, shortness of breath, wheezing and stridor.   Cardiovascular: Negative for chest pain, palpitations and leg swelling.  Gastrointestinal: Negative for abdominal distention, abdominal pain, anal bleeding, blood in stool, constipation and diarrhea.  Genitourinary: Negative for difficulty urinating, dysuria, flank pain and hematuria.  Musculoskeletal: Negative for arthralgias, back pain, gait problem, joint swelling and myalgias.  Skin: Negative for color change, pallor, rash and wound.  Neurological: Negative for tremors, weakness and light-headedness.  Hematological: Negative for adenopathy. Does not bruise/bleed easily.  Psychiatric/Behavioral: Negative for agitation, behavioral problems, confusion, decreased concentration, dysphoric mood and sleep disturbance.       Objective:   Physical Exam  Constitutional: She is oriented to person, place, and time. She appears well-nourished. No distress.  HENT:  Head: Normocephalic and atraumatic.  Mouth/Throat: Uvula is  midline and oropharynx is clear and moist. No oropharyngeal exudate.  Eyes: Conjunctivae and EOM are normal. No scleral icterus.  Neck: Normal range of motion. Neck supple.  Cardiovascular: Normal rate and regular rhythm.   Pulmonary/Chest: Effort normal. No respiratory distress. She has no wheezes.  Abdominal: She exhibits no distension.  Musculoskeletal: She exhibits no edema.  Neurological: She is alert and oriented to person, place, and time. She exhibits normal muscle tone. Coordination normal.  Skin: Skin is warm and dry. No rash noted. She is not diaphoretic. No erythema. No pallor.  Psychiatric: She has a normal mood and affect. Her behavior is normal. Judgment and thought content normal.          Assessment & Plan:   HIV/AIDS: I'm very frustrated by the fact that she is not on meds and I am not so sure that she really did not have SYMTUZA filled. I suspect rather that her husband may have taken it instead.  I sent script for BIKTARVY today for her to [facility] and  plan to have her come back in one month but we need to also counsel her on risk for NTD with BIC based on DTG. Either she can be on a more tolerable regimen such as BIKTARVY with risk for NTD or on less tolerable one for her such as Prezcobix + Descovy.  I could be MORE open to other options but given her very poor adherence I am very reluctant.   Perhaps DELSTRIGO would be better option but is it covered by ADAP yet. It had higher barrier to R in clinical trials. We could also look at Isentress HD and Descovy as well but barrier to R is lower   Chronic hepatitis B without delta agent and without hepatic coma: To be on covered with TAF/FTC or TDF/FTC   I spent greater than 25 minutes with the patient including greater than 50% of time in face to face counsel of the patient and in coordination of her care including counselling re need to be adherent to ARV regimens, why we had picked regimens with high barrier to  resistance with her given her poor adherence in the past.   '

## 2017-03-08 ENCOUNTER — Other Ambulatory Visit: Payer: Self-pay | Admitting: Pharmacist

## 2017-03-08 ENCOUNTER — Other Ambulatory Visit: Payer: Self-pay | Admitting: Pharmacist Clinician (PhC)/ Clinical Pharmacy Specialist

## 2017-03-08 MED ORDER — RALTEGRAVIR POTASSIUM 600 MG PO TABS: 2.0000 | ORAL_TABLET | Freq: Every day | ORAL | 6 refills | Status: DC

## 2017-03-08 MED ORDER — EMTRICITABINE-TENOFOVIR AF 200-25 MG PO TABS: 1.0000 | ORAL_TABLET | Freq: Every day | ORAL | 6 refills | Status: DC

## 2017-03-08 NOTE — Progress Notes (Signed)
Thanks Minh! 

## 2017-03-08 NOTE — Progress Notes (Unsigned)
Trying to get pregnant again. We have to change her regimen to Isentress HD and Descovy. [facility] [address] They are going to pick up right after the clinic today. Dr. Daiva EvesVan Dam is aware. Stressed the importance of being compliant again.

## 2017-04-07 ENCOUNTER — Telehealth: Payer: Self-pay | Admitting: *Deleted

## 2017-04-07 NOTE — Telephone Encounter (Signed)
[facility] calling to confirm patient's regimen.  Per pharmacy, patient is now on Isentress HD 2 tablets daily, Descovy 1 tablet daily. See below. Andree CossHowell, Michelle M, RN   Ulyses SouthwardPham, Minh Q, RPH-CPP at 03/08/2017 11:25 AM   Status: Sign at close encounter    Trying to get pregnant again. We have to change her regimen to Isentress HD and Descovy. [facility] [address]. They are going to pick up right after the clinic today. Dr. Daiva EvesVan Dam is aware. Stressed the importance of being compliant again.     Daiva EvesVan Dam, Lisette Grinderornelius N, MD at 03/08/2017 11:25 AM   Status: Signed    Thanks Minh!

## 2017-05-02 IMAGING — CR DG CHEST 2V
2 series · 2 of 2 positions shown · non-contrast
Comparison: None.

CLINICAL DATA: Fever and weakness for 1 day. History of
hypertension.

EXAM:
CHEST  2 VIEW

[chest pa]
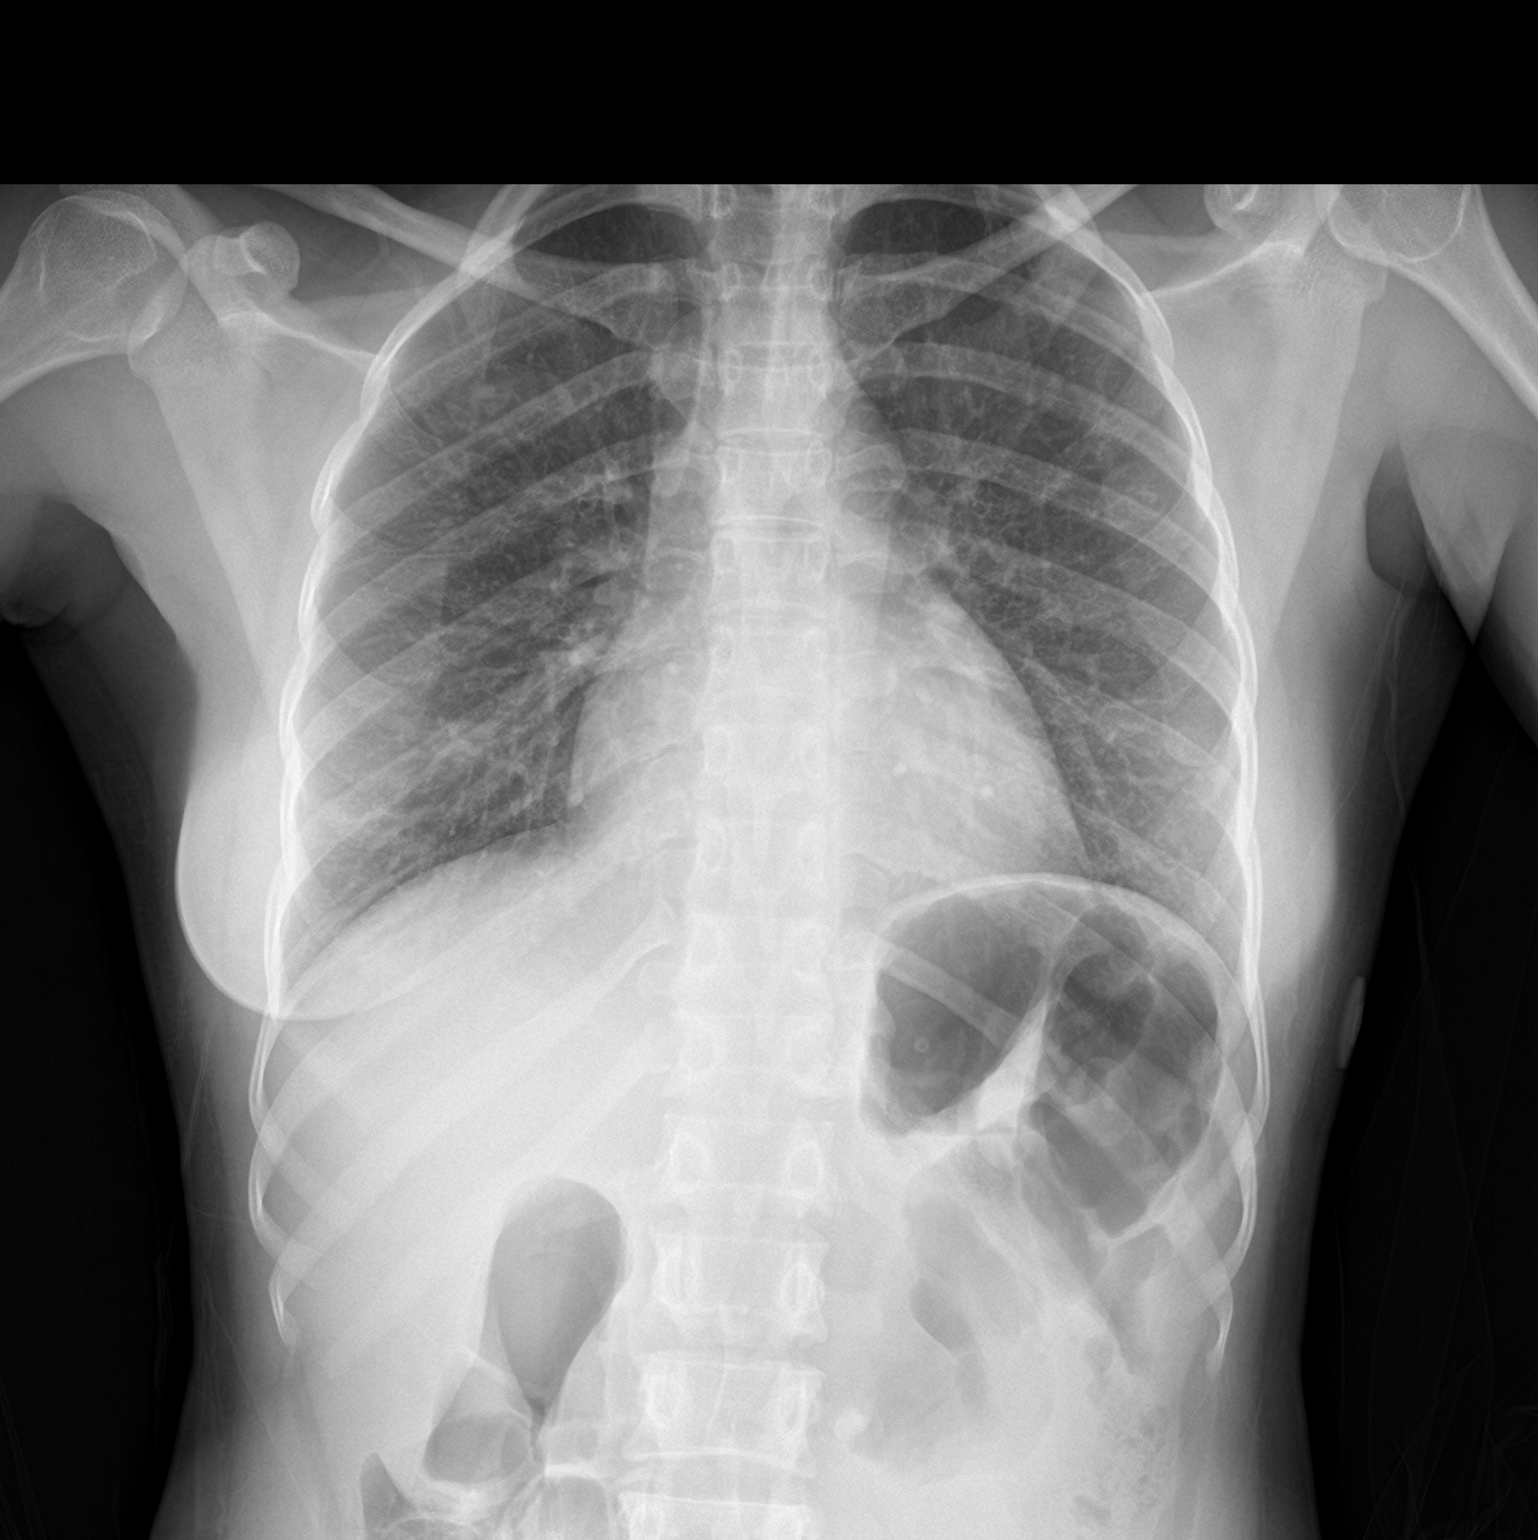

[chest lat]
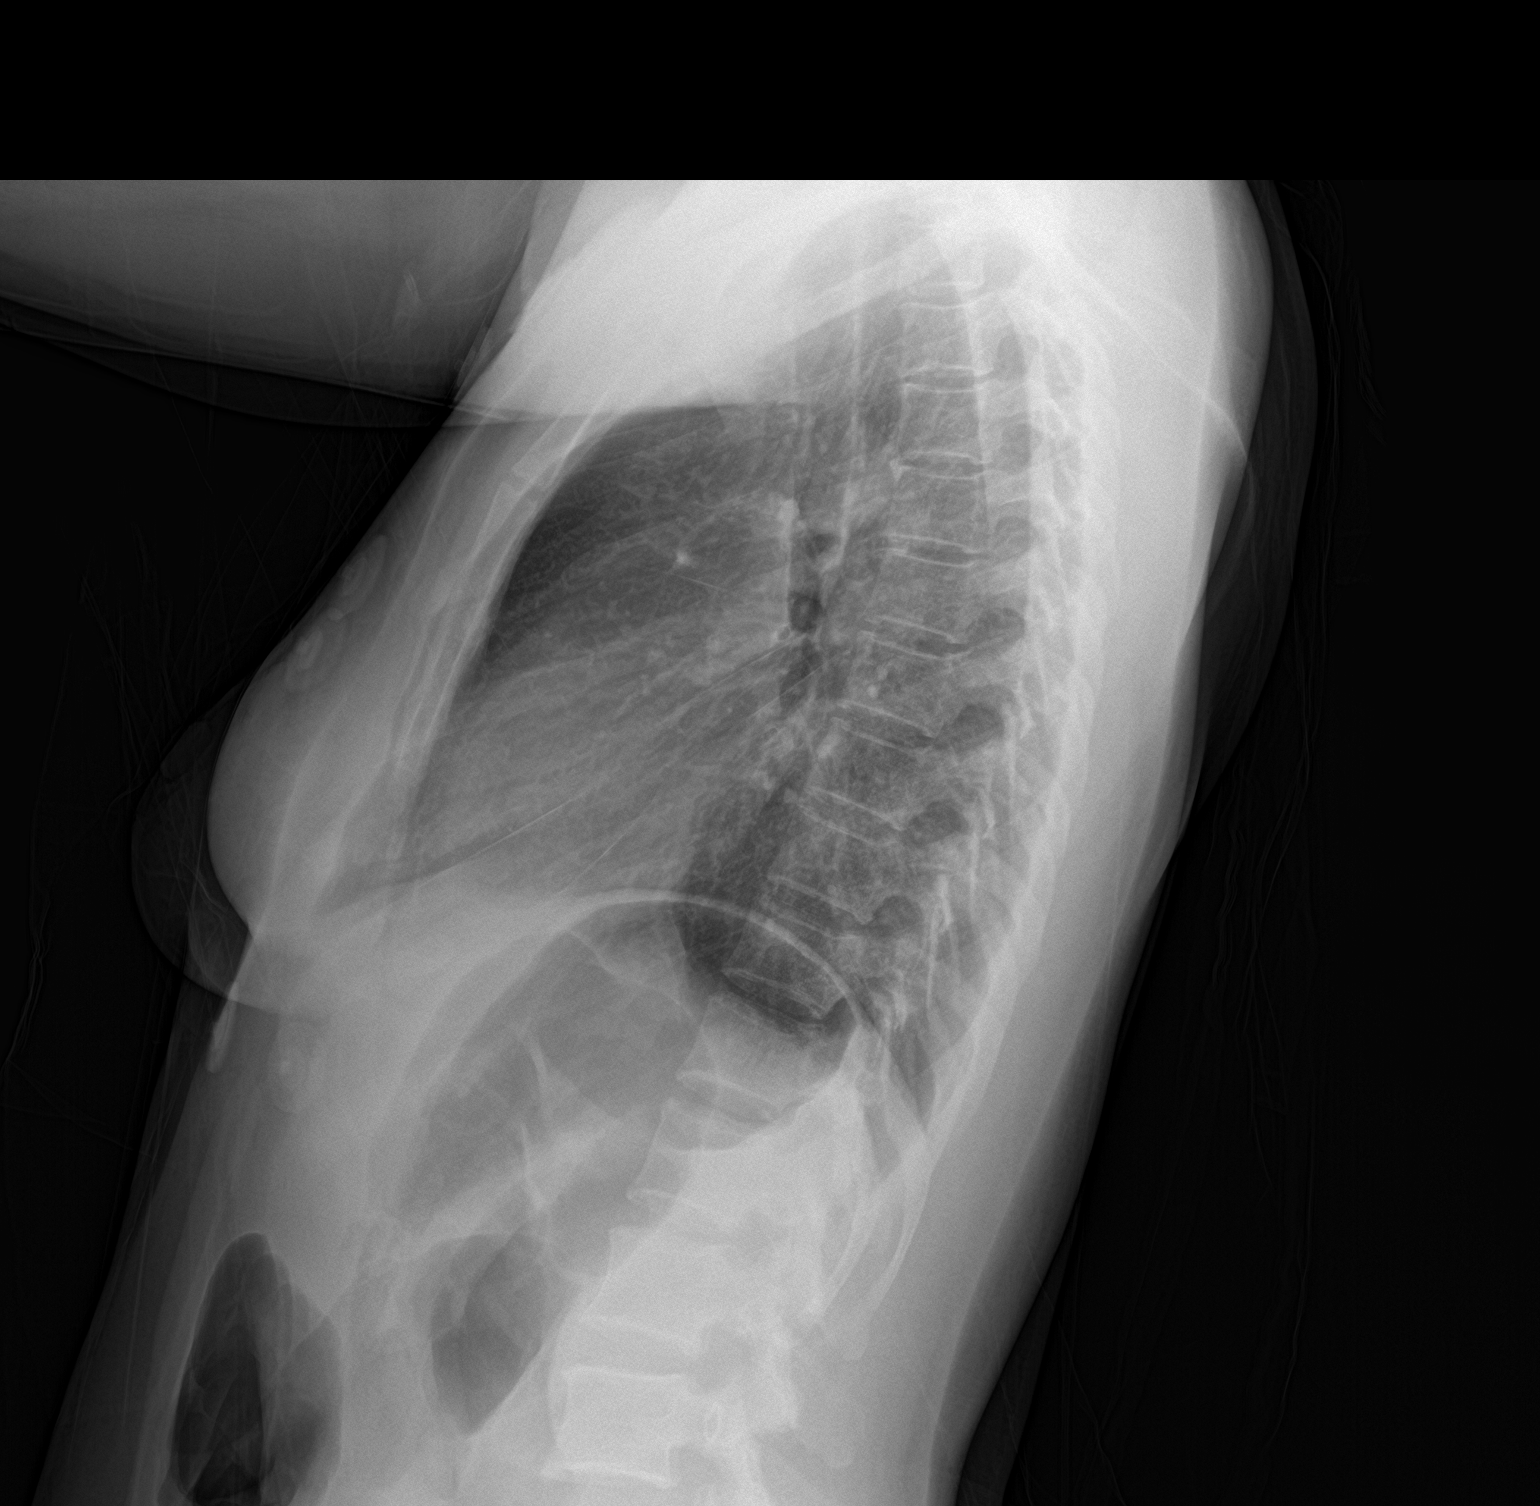

[2 of 2 positions shown; findings below may reference images not displayed]

FINDINGS: Cardiac silhouette is upper limits of normal in size, mediastinal
silhouette is nonsuspicious. No pleural effusion or focal
consolidation. No pneumothorax. Soft tissue planes and included
osseous structures are normal. Nonspecific small density in LEFT
abdomen may be osseous or, enteric.
IMPRESSION: Borderline cardiomegaly.  No acute pulmonary process.

## 2017-06-22 ENCOUNTER — Ambulatory Visit: Payer: Self-pay | Admitting: Infectious Disease

## 2017-06-25 ENCOUNTER — Telehealth: Payer: Self-pay

## 2017-06-25 ENCOUNTER — Telehealth: Payer: Self-pay | Admitting: *Deleted

## 2017-06-25 NOTE — Telephone Encounter (Signed)
Spoke with the patient's husband and arranged an appt for the couple prior to seeing the [facility]. After reading the note RN contacted Ms Pola CornKashindi as well and made her aware that she has a upcoming appt for Monday 02/25 at 10:30 and 11 am with the pharmacist. Ms. Adair PatterKashinidi verbalized understanding. I offered transportation to Ms. Sabel to ensure she can make it but she declined stating she has a transportation of her own.

## 2017-06-25 NOTE — Telephone Encounter (Signed)
[name] with [facility] is calling regarding Marie Olson.  Malachi Bondsda is the Child psychotherapistsocial worker for [name]'s child.  There is concern related to the missed appointments for [name].  Malachi Bondsda would like to assist and has found resources through [place] [employer] services.    [name] has been assigned to help the family.    There have been issues with the child not receiving medications as directed .  The father is usually the one who instructions are given to and he does  not clearly shared the information with [name].  She has  missed several  appointments, yet her spouse comes for routine visits and does not bring her.    The expectation is [facility] would take this patient and provided services as needed.   I will reach out to [place] to arrange office visit and referral to [facility] so this patient can receive care since it has become obvious the husband has no intention of bringing her for visits.   Marie Josephsammy K Ireland Virrueta, RN

## 2017-06-28 ENCOUNTER — Ambulatory Visit (INDEPENDENT_AMBULATORY_CARE_PROVIDER_SITE_OTHER): Payer: Self-pay | Admitting: Pharmacist Clinician (PhC)/ Clinical Pharmacy Specialist

## 2017-06-28 ENCOUNTER — Ambulatory Visit: Payer: Self-pay | Admitting: *Deleted

## 2017-06-28 DIAGNOSIS — Z789 Other specified health status: Secondary | ICD-10-CM

## 2017-06-28 DIAGNOSIS — Z23 Encounter for immunization: Secondary | ICD-10-CM

## 2017-06-28 DIAGNOSIS — B20 Human immunodeficiency virus [HIV] disease: Secondary | ICD-10-CM

## 2017-06-28 DIAGNOSIS — Z9119 Patient's noncompliance with other medical treatment and regimen: Secondary | ICD-10-CM

## 2017-06-28 DIAGNOSIS — Z91199 Patient's noncompliance with other medical treatment and regimen due to unspecified reason: Secondary | ICD-10-CM

## 2017-06-28 LAB — COMPLETE METABOLIC PANEL WITH GFR
AG RATIO: 0.9 (calc) — AB (ref 1.0–2.5)
ALBUMIN MSPROF: 4.1 g/dL (ref 3.6–5.1)
ALKALINE PHOSPHATASE (APISO): 56 U/L (ref 33–115)
ALT: 8 U/L (ref 6–29)
AST: 22 U/L (ref 10–30)
BUN: 15 mg/dL (ref 7–25)
CO2: 25 mmol/L (ref 20–32)
Calcium: 9.3 mg/dL (ref 8.6–10.2)
Chloride: 103 mmol/L (ref 98–110)
Creat: 0.75 mg/dL (ref 0.50–1.10)
GFR, EST NON AFRICAN AMERICAN: 108 mL/min/{1.73_m2} (ref >=60)
GFR, Est African American: 125 mL/min/{1.73_m2} (ref >=60)
Globulin: 4.6 g/dL (calc) — ABNORMAL HIGH (ref 1.9–3.7)
Glucose, Bld: 88 mg/dL (ref 65–99)
POTASSIUM: 3.9 mmol/L (ref 3.5–5.3)
Sodium: 136 mmol/L (ref 135–146)
Total Bilirubin: 0.3 mg/dL (ref 0.2–1.2)
Total Protein: 8.7 g/dL — ABNORMAL HIGH (ref 6.1–8.1)

## 2017-06-28 MED ORDER — RALTEGRAVIR POTASSIUM 400 MG PO TABS: 400.0000 mg | ORAL_TABLET | Freq: Two times a day (BID) | ORAL | 11 refills | Status: DC

## 2017-06-28 MED ORDER — EMTRICITABINE-TENOFOVIR DF 200-300 MG PO TABS: 1.0000 | ORAL_TABLET | Freq: Every day | ORAL | 11 refills | Status: DC

## 2017-06-28 NOTE — Progress Notes (Signed)
HPI: Marie Olson is a 27 y.o. female who is here to see pharmacy for her adherence issue.   Allergies: No Known Allergies  Vitals:    Past Medical History: Past Medical History:  Diagnosis Date  . Abscess   . 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 Dysphagia 04/08/2016  . HIV (human immunodeficiency virus infection) (HCC)   . Housing problems 12/24/2014  . Hypertension   . Symptoms of upper respiratory infection (URI) 04/08/2016    Social History: Social History   Socioeconomic History  . Marital status: Married    Spouse name: Not on file  . Number of children: Not on file  . Years of education: Not on file  . Highest education level: Not on file  Social Needs  . Financial resource strain: Not on file  . Food insecurity - worry: Not on file  . Food insecurity - inability: Not on file  . Transportation needs - medical: Not on file  . Transportation needs - non-medical: Not on file  Occupational History  . Not on file  Tobacco Use  . Smoking status: Never Smoker  . Smokeless tobacco: Never Used  Substance and Sexual Activity  . Alcohol use: No    Alcohol/week: 0.0 oz  . Drug use: No  . Sexual activity: Yes    Partners: Male    Birth control/protection: None    Comment: pt declined condoms  Other Topics Concern  . Not on file  Social History Narrative  . Not on file    Previous Regimen: Tvicay/Descovy, Symtuza  Current Regimen: Isentress HD/Descovy  Labs: HIV 1 RNA Quant  Date Value  02/17/2017 56,000 Copies/mL (H)  01/20/2017 110,000 copies/mL (H)  03/11/2016 <20 copies/mL   CD4 T Cell Abs (/uL)  Date Value  02/17/2017 260 (L)  01/20/2017 210 (L)  05/13/2016 350 (L)   Hep B S Ab (no units)  Date Value  08/17/2014 NEG   Hepatitis B Surface Ag (no units)  Date Value  08/17/2014 POSITIVE (A)   HCV Ab (no units)  Date Value  08/17/2014 NEGATIVE    CrCl: CrCl cannot be calculated (Patient's most  recent lab result is older than the maximum 21 days allowed.).  Lipids:    Component Value Date/Time   CHOL 181 10/03/2015 1152   TRIG 53 10/03/2015 1152   HDL 59 10/03/2015 1152   CHOLHDL 3.1 10/03/2015 1152   VLDL 11 10/03/2015 1152   LDLCALC 111 10/03/2015 1152    Assessment: Marie Olson has a very poor hx of adherence. She claimed to been taking her meds without missing doses. However, she stated this before but her VL has always been out of control. Our plan was to use the Isentress HD/Descovy until she becomes pregnant but we will change it today to the recommended regiment of Isentress and Truvada. We are going to repeat all labs today including a POCT pregnancy test. [facility]. She and her husband will meet with [facility] from [facility] today to renew ADAP.   Gave her the second Menveo vaccine. She has an appt with Dr. Daiva Eves in March. Will schedule the husband for his visit at that time, too. Made her a calendar. Counseled her repeatedly on the new scheduling of her meds through the interpreter. She claimed to understand it by repeating back to [place].   Recommendations:  Change Isentress HD/Descovy to  Isentress 400mg  PO BID + Truvada 1 PO qday HIV  and hep C labs today F/u with Dr Daiva EvesVan Dam in March Meveo #2   Marie Olson, PharmD, BCPS, AAHIVP, CPP Clinical Infectious Disease Pharmacist [facility] 06/28/2017, 11:23 AM

## 2017-06-29 ENCOUNTER — Ambulatory Visit: Payer: Self-pay | Admitting: *Deleted

## 2017-06-29 ENCOUNTER — Telehealth: Payer: Self-pay | Admitting: *Deleted

## 2017-06-29 DIAGNOSIS — Z9119 Patient's noncompliance with other medical treatment and regimen: Secondary | ICD-10-CM

## 2017-06-29 DIAGNOSIS — Z91199 Patient's noncompliance with other medical treatment and regimen due to unspecified reason: Secondary | ICD-10-CM

## 2017-06-29 DIAGNOSIS — B20 Human immunodeficiency virus [HIV] disease: Secondary | ICD-10-CM

## 2017-06-29 LAB — T-HELPER CELL (CD4) - (RCID CLINIC ONLY)
CD4 % Helper T Cell: 20 % — ABNORMAL LOW (ref 33–55)
CD4 T Cell Abs: 310 /uL — ABNORMAL LOW (ref 400–2700)

## 2017-06-30 LAB — HEPATITIS B DNA, ULTRAQUANTITATIVE, PCR
Hepatitis B DNA (Calc): <1 Log IU/mL
Hepatitis B DNA: <10 IU/mL

## 2017-07-03 LAB — HIV RNA, RTPCR W/R GT (RTI, PI,INT)
HIV 1 RNA Quant: 31 copies/mL — ABNORMAL HIGH
HIV-1 RNA Quant, Log: 1.49 Log copies/mL — ABNORMAL HIGH

## 2017-07-05 LAB — HEPATITIS B SURFACE ANTIGEN: Hepatitis B Surface Ag: REACTIVE — AB

## 2017-07-05 LAB — CBC
HEMATOCRIT: 35.3 % (ref 35.0–45.0)
HEMOGLOBIN: 12.1 g/dL (ref 11.7–15.5)
MCH: 30.2 pg (ref 27.0–33.0)
MCHC: 34.3 g/dL (ref 32.0–36.0)
MCV: 88 fL (ref 80.0–100.0)
MPV: 10.2 fL (ref 7.5–12.5)
Platelets: 290 10*3/uL (ref 140–400)
RBC: 4.01 10*6/uL (ref 3.80–5.10)
RDW: 13 % (ref 11.0–15.0)
WBC: 4.2 10*3/uL (ref 3.8–10.8)

## 2017-07-05 LAB — HEPATITIS B E ANTIBODY: HEP B E AB: REACTIVE — AB

## 2017-07-05 LAB — HEPATITIS B E ANTIGEN: HEP B E AG: NONREACTIVE

## 2017-07-22 NOTE — Progress Notes (Signed)
Co-vist performed with pharmacy with a interpreter present. At this time we discussed my role with the health care team and offered support with staying in care and adherent to medications. [name] appreciated the help and when asked about other struggles he stated he would like to relocate because his dtr has fallen down the entire flight of stairs several times. I asked about a baby gates and offered to donate one. At this time consents were reviewed and signed with a visit planned for tomorrow.

## 2017-07-22 NOTE — Patient Instructions (Signed)
RN met with client and or caregiver for assessment. RN reviewed Agency Services, [place] Notice of Privacy Policy, Home Safety Management Information Booklet. Home Fire Safety Assessment, Fall Risk Assessment and Suicide Risk Assessment was performed. RN also discussed information on a Living Will, Advanced Directives, and Health Care Power of Attorney. RN and Client/Designated Party educated/reviewed/signed Client Agreement and Consent for Service form along with Patient Rights and Responsibilities statement. RN developed patient specific and centered care plan. RN provided contact information and reviewed how to receive emergency help after hours for schedule changes, billing questions, reporting of safety issues, falls, concerns or any needs/questions. Standard Precaution and Infection control along with interventions to correct or prevent high risk behaviors instructed to the patient. Client/Caregiver reports understanding and agreement with the above 

## 2017-07-22 NOTE — Progress Notes (Signed)
Initial home visit made today after gathering a donated gate. While trying to install the gate the patient's husband stated their dtr will just step over the gate.  At this time I realized that the dtr is of school age and not a baby. They dtr trouble with ambulation due to a birth defect. At this time we reviewed I would like to discuss there medications but the family wanted to use their own interpreter so I did not discuss the HIV status.

## 2017-07-27 ENCOUNTER — Encounter: Payer: Self-pay | Admitting: *Deleted

## 2017-07-27 NOTE — Telephone Encounter (Signed)
I acknowledge and approve of this plan of care. 

## 2017-07-27 NOTE — Telephone Encounter (Signed)
Initial Assessment Points to Consider for Care  Swahili Speaking Patient  Points are not all inclusive to services and educated provided but supports the patient's Individualized Plan of Care.  . Is Home safe for visits? Yes  / Are all firearms or weapons secure? Yes . Insurance Coverage: MCD . 1st HIV Diagnosis: 06/29/14 . Mode of HIV Transmission: Heterosexual Contact . Functional Status: no limitations noted . Current Housing/Needs: single story housing due to ambulation difficulties/fall  of her dtr . Social Support/System: appears to be in place . Culture/Religion/Spirituality: not discussed . Educational Background: raised in [place]/imm[place] to the [place] an adult . Legal Issues: none noted . Access/Utilization of Community Resources: able to assess and engage with [facility] . Mental Health Concerns/Diagnosis: none noted or documented . Alcohol and/or Drug Use: none noted or documented . Risk and Knowledge of HIV and Reduction in Transmission: is questionable and will be addressed. Patient speaks swahili and used a personal friend for translation so HIV status was not addressed . Nutritional Needs: none noted  Order received on  by 06/25/17 by Dr. Ulyses JarredVan Dam/Tammy King RN [facility]). 1st contact made on 06/25/17 Patient was evaluated on 06/28/17 and completed on 06/29/17 for [facility]. Patient was consented to care at this time.   Frequency / Duration of [facility] visits: Effective:06/29/17 671mo1, 232mo2,1mo1   4 PRN's for complications with disease process/progression, medication changes or concerns   [facility] will assess for learning needs related to diagnosis and treatment regimen, provide education as needed, fill pill box if needed, address any barriers which may be preventing medication compliance, and communicating with care team including physician and case manager.   Individualized Plan Of Care Certification Period  from 06/29/17 to 09/27/17  a. Type of service(s) and care to be delivered: RN Case Management  b. Frequency and duration of service: Effective 06/29/17 221mo1, 762mo2, 551mo1, 4 prns for complications  with disease process/progression, medication changes or concerns . Visits/Contact may be  conducted telephonically or in person to  best suit the patient.  c. Activity restrictions: Pt may be up as tolerated and can safely ambulate without the need for a assistive device   d. Safety Measures: Standard Precautions/Infection Control   e. Service Objectives and Goals: Service Objectives are to assist the pt with HIV medication regimen adherence and staying in care with the Infectious Disease Clinic by identifying barriers to care.  RN will address the barriers that are identified by the patient. [name] would like to have another child and stay in care on on her medications.  Her daughter has fallen down the stairs of their apartment  several times and they would like to relocate to a one story home due to safety concerns.  f. Equipment required: No additional equipment needs at this time   g. Functional Limitations: None noted  h. Rehabilitation potential: Guarded   i. Diet and Nutritional Needs: Regular Diet   j. Medications and treatments: Medications have been reconciled and reviewed and are a part of EPIC electronic file   k. Specific therapies if needed: RN   l. Pertinent diagnoses: HIV disease,  Hx of medication NonCompliance   m. Expected outcome: Guarded

## 2017-07-28 ENCOUNTER — Ambulatory Visit: Payer: Self-pay | Admitting: Infectious Disease

## 2017-08-23 NOTE — Telephone Encounter (Signed)
ERROR

## 2017-08-27 ENCOUNTER — Telehealth: Payer: Self-pay | Admitting: *Deleted

## 2017-08-27 ENCOUNTER — Telehealth: Payer: Self-pay

## 2017-08-27 ENCOUNTER — Ambulatory Visit: Payer: Self-pay | Admitting: *Deleted

## 2017-08-27 DIAGNOSIS — B20 Human immunodeficiency virus [HIV] disease: Secondary | ICD-10-CM

## 2017-08-27 NOTE — Telephone Encounter (Signed)
Noted that the patient missed her March appt with Dr Daiva EvesVan Dam and has a upcoming appt with him on the 1st(May). I wanted to be sure she has her medications on hand as well and if not I can arrange to get the medications to her.  Contacted [employer] and we did not receive an answer and the VM has not been set up. We attempted the call the husband as well and did not receive an answer and VM was not set up either.   I would also like to let them know that [employer] is opening their waiting lists beginning May 1st (section 8 waiting list, income based, and public housing) and they can begin to get the needed documents together for section 8. At minimum, ID , ss card, birth certificate, proof of income are needed for regular apartment. No apartment is going to house them without that.

## 2017-08-27 NOTE — Telephone Encounter (Signed)
Social Worker from [facility] calling to share information related to patient's child's visit.    The patient admitted to taking the child medication at some point since she was without medication.

## 2017-08-30 ENCOUNTER — Ambulatory Visit: Payer: Self-pay | Admitting: *Deleted

## 2017-08-30 ENCOUNTER — Other Ambulatory Visit: Payer: Self-pay | Admitting: *Deleted

## 2017-08-30 ENCOUNTER — Telehealth: Payer: Self-pay | Admitting: *Deleted

## 2017-08-30 DIAGNOSIS — Z91199 Patient's noncompliance with other medical treatment and regimen due to unspecified reason: Secondary | ICD-10-CM

## 2017-08-30 DIAGNOSIS — Z9119 Patient's noncompliance with other medical treatment and regimen: Secondary | ICD-10-CM

## 2017-08-30 DIAGNOSIS — B20 Human immunodeficiency virus [HIV] disease: Secondary | ICD-10-CM

## 2017-08-30 NOTE — Telephone Encounter (Signed)
Contacted V[place]ia(through Education officer, community) at the number listed x 2 without an answer. You cannot leave a VM.  I then called her husband(Marie Olson) who stated she is home and should answer. He confirmed that the number is correct and asked that I try my call again. I asked the interpreter to call again and after 2 more attempts I still could not reach Marie Olson.   Contacted [facility] to see if the Emergency ADAP is active yet. I may have to do a drive by to the home and wanted to have the medications in hand. Spoke with Ms Marie Olson and she stated no additional information has been placed since the 08/01/17 ADAP expired.   Spoke with Marie Olson/[facility] and she stated when she spoke with Marie Olson on Friday it was too late to apply for Emergency ADAP because she would not be able to get signatures for the application so she is working on Emergency Medication right now. We discussed Marie Olson current regimen and I told Marie Duster that I am here as soon at the medication is ready.

## 2017-08-30 NOTE — Telephone Encounter (Signed)
Error

## 2017-08-30 NOTE — Telephone Encounter (Signed)
Spoke with Malachi Bonds today to see how to best coordinate our services to meet the needs of the patient.   Our team discovered from [facility] P[facility] ID SW) that Keilany's dtr's medication regimen had changed and her dtr had leftover medication so Melisia took the leftover medication. Nargis did not have any medication for herself so she used her dtr's unused, leftover meds. During Taylynn's last visit with our ID clinic her viral load was 31 and so I assumed she was taking her own medications.   I want to 1st be sure Seara gets her medications each month and stress to Princes to please call me and I am happy to help her. I also want to build a rapport with Tanyika and explain to her why sharing medications is not good since it  increases the risk of resistance. Malachi Bonds stated the dtr already has a lot of resistance at this time.   Malachi Bonds stated typically the family comes alone and she(Ida) arranges transportation through MCD transport. Syniyah does not have MCD so [employer] transport is not an option for her appts but the Dtr does.  Currently [employer] is working on reapplying for disability for the child since the child suffered strokes and seizures and has severe cognitive delays.  The family is already receiving SNAP services and all the kids are covered by MCD. Malachi Bonds believes the husband is covered through his job but not Programme researcher, broadcasting/film/video.  Malachi Bonds stated she is concerned about the cost of the new home they just moved into which is 700/month and she does not think the family is receiving assistance with the rent.   I informed Malachi Bonds that the 1st thing I plan to do is see if I can get with the [employer] case manager that is working with Otho Darner so we can plan a home visit together (I hope to gain a better rapport with Dimple) at this time I hope to have her medications in hand as well.

## 2017-08-31 ENCOUNTER — Telehealth: Payer: Self-pay | Admitting: *Deleted

## 2017-08-31 NOTE — Telephone Encounter (Addendum)
Contacted the patient and her husband both x2 through [employer] but unable to get an answer. I would like to remind her of her appt. During our last visit I saved my number in Karizma's phone so I called from my number and she answered. Her son translated for [place] and I told him that his mom has an appt on tomorrow at 11am. She asked if she should come by herself. I told her that her husband can come too so we can complete the ppk for his medication coverage (ADAP expired in March). She replied ok

## 2017-09-01 ENCOUNTER — Ambulatory Visit (INDEPENDENT_AMBULATORY_CARE_PROVIDER_SITE_OTHER): Payer: Medicaid Other | Admitting: Infectious Disease

## 2017-09-01 ENCOUNTER — Encounter: Payer: Self-pay | Admitting: Infectious Disease

## 2017-09-01 ENCOUNTER — Other Ambulatory Visit (HOSPITAL_COMMUNITY)
Admission: RE | Admit: 2017-09-01 | Discharge: 2017-09-01 | Disposition: A | Discharge status: Home / Self Care | Payer: Medicaid Other | Source: Ambulatory Visit | Attending: Infectious Disease | Admitting: Infectious Disease

## 2017-09-01 VITALS — BP 129/82 | HR 99 | Temp 98.6°F | Wt 95.5 lb

## 2017-09-01 DIAGNOSIS — B20 Human immunodeficiency virus [HIV] disease: Secondary | ICD-10-CM

## 2017-09-01 DIAGNOSIS — Z9119 Patient's noncompliance with other medical treatment and regimen: Secondary | ICD-10-CM

## 2017-09-01 DIAGNOSIS — L732 Hidradenitis suppurativa: Secondary | ICD-10-CM

## 2017-09-01 DIAGNOSIS — Z91199 Patient's noncompliance with other medical treatment and regimen due to unspecified reason: Secondary | ICD-10-CM

## 2017-09-01 DIAGNOSIS — B181 Chronic viral hepatitis B without delta-agent: Secondary | ICD-10-CM

## 2017-09-01 LAB — LIPID PANEL
CHOL/HDL RATIO: 2.9 (calc) (ref <5.0)
CHOLESTEROL: 181 mg/dL (ref <200)
HDL: 62 mg/dL (ref >50)
LDL CHOLESTEROL (CALC): 105 mg/dL — AB
Non-HDL Cholesterol (Calc): 119 mg/dL (calc) (ref <130)
TRIGLYCERIDES: 59 mg/dL (ref <150)

## 2017-09-01 LAB — COMPLETE METABOLIC PANEL WITH GFR
AG Ratio: 1.1 (calc) (ref 1.0–2.5)
ALKALINE PHOSPHATASE (APISO): 53 U/L (ref 33–115)
ALT: 8 U/L (ref 6–29)
AST: 22 U/L (ref 10–30)
Albumin: 4.3 g/dL (ref 3.6–5.1)
BUN: 13 mg/dL (ref 7–25)
CALCIUM: 9.2 mg/dL (ref 8.6–10.2)
CO2: 27 mmol/L (ref 20–32)
CREATININE: 0.89 mg/dL (ref 0.50–1.10)
Chloride: 102 mmol/L (ref 98–110)
GFR, EST NON AFRICAN AMERICAN: 88 mL/min/{1.73_m2} (ref >=60)
GFR, Est African American: 102 mL/min/{1.73_m2} (ref >=60)
GLOBULIN: 4 g/dL — AB (ref 1.9–3.7)
GLUCOSE: 100 mg/dL — AB (ref 65–99)
Potassium: 4 mmol/L (ref 3.5–5.3)
SODIUM: 137 mmol/L (ref 135–146)
Total Bilirubin: 0.4 mg/dL (ref 0.2–1.2)
Total Protein: 8.3 g/dL — ABNORMAL HIGH (ref 6.1–8.1)

## 2017-09-01 LAB — CBC WITH DIFFERENTIAL/PLATELET
BASOS PCT: 1.1 %
Basophils Absolute: 40 cells/uL (ref 0–200)
EOS ABS: 626 {cells}/uL — AB (ref 15–500)
Eosinophils Relative: 17.4 %
HCT: 37.6 % (ref 35.0–45.0)
HEMOGLOBIN: 12.9 g/dL (ref 11.7–15.5)
Lymphs Abs: 1231 cells/uL (ref 850–3900)
MCH: 30.1 pg (ref 27.0–33.0)
MCHC: 34.3 g/dL (ref 32.0–36.0)
MCV: 87.9 fL (ref 80.0–100.0)
MONOS PCT: 9.5 %
MPV: 10.4 fL (ref 7.5–12.5)
NEUTROS ABS: 1361 {cells}/uL — AB (ref 1500–7800)
Neutrophils Relative %: 37.8 %
Platelets: 226 10*3/uL (ref 140–400)
RBC: 4.28 10*6/uL (ref 3.80–5.10)
RDW: 12.3 % (ref 11.0–15.0)
Total Lymphocyte: 34.2 %
WBC: 3.6 10*3/uL — AB (ref 3.8–10.8)
WBCMIX: 342 {cells}/uL (ref 200–950)

## 2017-09-01 MED ORDER — DARUN-COBIC-EMTRICIT-TENOFAF 800-150-200-10 MG PO TABS: 1.0000 | ORAL_TABLET | Freq: Every day | ORAL | 5 refills | Status: DC

## 2017-09-01 NOTE — Progress Notes (Signed)
Chief complaint:  Here for followup for HIV  Subjective:    Patient ID: Marie Olson, female    DOB: 06/02/1990, 27 y.o.   MRN: [id number]  HPI  27 year old Swahili speaking African lady with newly diagnosed HIV, here with HIV + husband . She had fairly high viral load and CD4 just above 200 when we first checked it.  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   We tried Genvoya for her but due to her terrible compliance changed her over to PI based therapy.  And she continue to show poor adherence with this regimen leading me to change her over to [facility] and DESCOVY and she responded dramatically was free happy with the new regimen. She also became undetectable and her CD4 count improved.   She then came off of her medications.   She and her husband were also  trying to have another child . She and he are ready have an HIV-positive child which they bring to [facility].   Now that we had new data with regards t DTG and potential neural due tube defects I wanted to have her off of a DTG regimen and reinstitute at PI based regimen and I sent SYMTUZA to [facility]. This was picked up but I have had suspicion that it was actually taken by her husband instead. We then placed her on PRezcobix and Descovy but she did not tolerate this well. She was ultimately changed to Isentress BID and Descovy. She however began taking this with ONLY ONE ISENTRESS and one Descovy per day x past 2 months. She also failed to renew her ADAP application.        Past Medical History:  Diagnosis Date  . Abscess   . 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 Dysphagia 04/08/2016  . HIV (human immunodeficiency virus infection) (HCC)   . Housing problems 12/24/2014  . Hypertension   . Symptoms of upper respiratory infection (URI) 04/08/2016    Past Surgical History:  Procedure Laterality Date  . DIAGNOSTIC  LAPAROSCOPY WITH REMOVAL OF ECTOPIC PREGNANCY N/A 06/03/2015   Procedure: DIAGNOSTIC LAPAROSCOPY,  Left Salpingectomy with  REMOVAL OF ECTOPIC PREGNANCY;  Surgeon: Adam Phenix, MD;  Location: [facility];  Service: Gynecology;  Laterality: N/A;    Family History  Family history unknown: Yes      Social History   Socioeconomic History  . Marital status: Married    Spouse name: Not on file  . Number of children: Not on file  . Years of education: Not on file  . Highest education level: Not on file  Occupational History  . Not on file  Social Needs  . Financial resource strain: Not on file  . Food insecurity:    Worry: Not on file    Inability: Not on file  . Transportation needs:    Medical: Not on file    Non-medical: Not on file  Tobacco Use  . Smoking status: Never Smoker  . Smokeless tobacco: Never Used  Substance and Sexual Activity  . Alcohol use: No    Alcohol/week: 0.0 oz  . Drug use: No  . Sexual activity: Yes    Partners: Male    Birth control/protection: None    Comment: pt declined condoms  Lifestyle  . Physical activity:    Days per week: Not on file    Minutes per session: Not on  file  . Stress: Not on file  Relationships  . Social connections:    Talks on phone: Not on file    Gets together: Not on file    Attends religious service: Not on file    Active member of club or organization: Not on file    Attends meetings of clubs or organizations: Not on file    Relationship status: Not on file  Other Topics Concern  . Not on file  Social History Narrative  . Not on file    No Known Allergies   Current Outpatient Medications:  .  emtricitabine-tenofovir (TRUVADA) 200-300 MG tablet, Take 1 tablet by mouth daily., Disp: 30 tablet, Rfl: 11 .  raltegravir (ISENTRESS) 400 MG tablet, Take 1 tablet (400 mg total) by mouth 2 (two) times daily., Disp: 60 tablet, Rfl: 11 .  TRINESSA, 28, 0.18/0.215/0.25 MG-35 MCG tablet, Take 1 tablet by mouth daily., Disp:  , Rfl: 11   Review of Systems  Constitutional: Negative for activity change, chills, diaphoresis, fatigue, fever and unexpected weight change.  HENT: Negative for congestion, rhinorrhea, sinus pressure, sneezing, sore throat and trouble swallowing.   Eyes: Negative for photophobia and visual disturbance.  Respiratory: Negative for cough, chest tightness, shortness of breath, wheezing and stridor.   Cardiovascular: Negative for chest pain, palpitations and leg swelling.  Gastrointestinal: Negative for abdominal distention, abdominal pain, anal bleeding, blood in stool, constipation and diarrhea.  Genitourinary: Negative for difficulty urinating, dysuria, flank pain and hematuria.  Musculoskeletal: Negative for arthralgias, back pain, gait problem, joint swelling and myalgias.  Skin: Negative for color change, pallor, rash and wound.  Neurological: Negative for tremors, weakness and light-headedness.  Hematological: Negative for adenopathy. Does not bruise/bleed easily.  Psychiatric/Behavioral: Negative for agitation, behavioral problems, confusion, decreased concentration, dysphoric mood and sleep disturbance.       Objective:   Physical Exam  Constitutional: She is oriented to person, place, and time. She appears well-nourished. No distress.  HENT:  Head: Normocephalic and atraumatic.  Mouth/Throat: Uvula is midline and oropharynx is clear and moist. No oropharyngeal exudate.  Eyes: Conjunctivae and EOM are normal. No scleral icterus.  Neck: Normal range of motion. Neck supple.  Cardiovascular: Normal rate and regular rhythm.  Pulmonary/Chest: Effort normal. No respiratory distress. She has no wheezes.  Abdominal: She exhibits no distension.  Musculoskeletal: She exhibits no edema.  Neurological: She is alert and oriented to person, place, and time. She exhibits normal muscle tone. Coordination normal.  Skin: Skin is warm and dry. No rash noted. She is not diaphoretic. No erythema. No  pallor.  Psychiatric: She has a normal mood and affect. Her behavior is normal. Judgment and thought content normal.          Assessment & Plan:   HIV/AIDS:   Check VL with reflex to genotype including INSTI. I would like to get her [facility] via [employer] and process ADAP.Hopefully she has not developed significant resistance and there will be several options. If somehow she does not have INSTI R I would consider BIKTARVY despite the concern re NTD given that were history the chance of her having another HIV+ patient is assuredly greater than that of NTD and she has not shown ability to be tolerant of PI based therapy   Chronic hepatitis B without delta agent and without hepatic coma: To be on covered with TAF/FTC or TDF/FTC  I spent greater than 40 minutes with the patient including greater than 50% of time in face to face  counsel of the patient re need to be adherent to ARV regimen, renew ADAP on time, nature of SYMTUZA as STR to changer her to, way that virus develops resistance and in coordination of her care.

## 2017-09-01 NOTE — Patient Instructions (Signed)
    zifuatazo zinatoka kwa mtumiaji wa [employer]:  tut[place]it[place] dawa za SYMTUZA wakati tunasubiri mpango wa usaidizi wa madawa ya kupitishwa. Ninakuhitaji kurudi na [place] baada ya kuwa na dawa mpya kwa mwezi mmoja

## 2017-09-02 LAB — T-HELPER CELL (CD4) - (RCID CLINIC ONLY)
CD4 T CELL ABS: 340 /uL — AB (ref 400–2700)
CD4 T CELL HELPER: 24 % — AB (ref 33–55)

## 2017-09-02 LAB — URINE CYTOLOGY ANCILLARY ONLY
CHLAMYDIA, DNA PROBE: NEGATIVE
NEISSERIA GONORRHEA: NEGATIVE

## 2017-09-02 LAB — RPR: RPR: NONREACTIVE

## 2017-09-06 ENCOUNTER — Other Ambulatory Visit: Payer: Medicaid Other | Admitting: *Deleted

## 2017-09-06 LAB — HIV RNA, RTPCR W/R GT (RTI, PI,INT)
HIV 1 RNA QUANT: 326 {copies}/mL — AB
HIV-1 RNA QUANT, LOG: 2.51 {Log_copies}/mL — AB

## 2017-09-07 ENCOUNTER — Other Ambulatory Visit: Payer: Medicaid Other | Admitting: *Deleted

## 2017-09-10 ENCOUNTER — Telehealth: Payer: Self-pay | Admitting: *Deleted

## 2017-09-10 NOTE — Telephone Encounter (Addendum)
Contacted Marie Olson through [employer]. She states she is doing well and received her medications in the mail. Marie Olson stated her husband received a call about the medications being delivered to their old address so he went and picked the medications up. I confirmed her current address and contacted [facility] and updated their system as well. We also confirmed the phone number and I asked that they call twice because the patient (who speaks Swahili) may not answer the 1st call but usually answers on the 2nd attempt.  Marie Olson stated she has a upcoming appt on June 6th and after checking her appt is on June 12th for labs(2pm) and June 26th with Dr Daiva Eves. I think she may have misunderstood the 26th for the 6th. Marie Olson asked if she could have a morning appt on the 12th to be sure she can pick her child up from the bus stop in the afternoon. I rescheduled her lab appt to 10:30am on the 12th for her. I also asked her if I could come by on Monday to get a copy of her husband check stubs so we can process ADAP for him. I would like to work on getting his medications as well.

## 2017-09-13 ENCOUNTER — Ambulatory Visit: Payer: Self-pay | Admitting: *Deleted

## 2017-09-13 DIAGNOSIS — B20 Human immunodeficiency virus [HIV] disease: Secondary | ICD-10-CM

## 2017-09-13 DIAGNOSIS — Z789 Other specified health status: Secondary | ICD-10-CM

## 2017-09-13 NOTE — Progress Notes (Signed)
We have an interpreter on sight(Fani) Has 28 pills left. 558.20 is the total amount due with a past due balance of 453.22. The current bill is 104.98. She is on a current disconnection and they have to pay 314.02 by 5pm on May 15th. [employer]) [phone] is helping her with the diasability application. 720 dollars for the rent. She no longer  Is receiving her 200 dollars for food stamps. The needed follow up is to get a letter stating we need letter stating we need a copy of her husband's pay stubs, I need to f/u with [place] [employer] to see if she has f/u on her dtr's disability application., get a sample of Genvoya for Alexie, and go to [employer] to see if they still have funding for [employer] assistance.   When writing the letter make sure to say you need the entire month of April.

## 2017-09-15 ENCOUNTER — Encounter: Payer: Self-pay | Admitting: *Deleted

## 2017-09-20 ENCOUNTER — Ambulatory Visit: Payer: Self-pay | Admitting: *Deleted

## 2017-09-20 DIAGNOSIS — Z91199 Patient's noncompliance with other medical treatment and regimen due to unspecified reason: Secondary | ICD-10-CM

## 2017-09-20 DIAGNOSIS — B20 Human immunodeficiency virus [HIV] disease: Secondary | ICD-10-CM

## 2017-09-20 DIAGNOSIS — Z9119 Patient's noncompliance with other medical treatment and regimen: Secondary | ICD-10-CM

## 2017-09-23 NOTE — Progress Notes (Signed)
Traveled to [facility] and worked on getting antiviral medication for [employer] After leaving the clinic traveled to [employer] to conference with Print[employer]) to come up with a plan to best serve Yanina without duplicating services

## 2017-09-23 NOTE — Progress Notes (Signed)
Drive by performed today since I could not reach Marie Olson by phone. Through [employer] we discussed her medication adherence. I provided her with the 30day supply of medication and we discussed which medications to stop and how to take her current regimen. SHe was able to verbalize instructions given and with hand gestures showed understanding of education given. At this time I also added my name and phone number to her phone so she will know when I call her even if I have to hand right up and then call back through interpreter services. During this visit Marie Olson explained that she was only taking her dtr's left over medications [facility] Changed her Dtr's regimen) because she did not have her own medications to take.

## 2017-09-28 ENCOUNTER — Telehealth: Payer: Self-pay | Admitting: *Deleted

## 2017-09-28 ENCOUNTER — Other Ambulatory Visit: Payer: Medicaid Other | Admitting: *Deleted

## 2017-09-30 NOTE — Telephone Encounter (Signed)
Initial Assessment Points to Consider for Care  Swahili Speaking Patient  Points are not all inclusive to services and educated provided but supports the patient's Individualized Plan of Care.  . Is Home safe for visits? Yes  / Are all firearms or weapons secure? Yes . Insurance Coverage: MCD . 1st HIV Diagnosis: 06/29/14 . Mode of HIV Transmission: Heterosexual Contact . Functional Status: no limitations noted . Current Housing/Needs: single story housing due to ambulation difficulties/fall  of her dtr . Social Support/System: appears to be in place . Culture/Religion/Spirituality: not discussed . Educational Background: raised in [place]/immigrated to the [place] as an adult . Legal Issues: none noted . Access/Utilization of Community Resources: able to assess and engage with [facility] . Mental Health Concerns/Diagnosis: none noted or documented . Alcohol and/or Drug Use: none noted or documented . Risk and Knowledge of HIV and Reduction in Transmission: is questionable and will be addressed. Patient speaks swahili and used a personal friend for translation so HIV status was not addressed . Nutritional Needs: none noted    Frequency / Duration of [facility] visits: Effective:09/28/17 51mo1, 66mo2,1mo1   4 PRN's for complications with disease process/progression, medication changes or concerns   [facility] will assess for learning needs related to diagnosis and treatment regimen, provide education as needed, fill pill box if needed, address any barriers which may be preventing medication compliance, and communicating with care team including physician and case manager.   Individualized Plan Of Care Certification Period from 09/28/17 to 12/27/17  a. Type of service(s) and care to be delivered: RN Case Management  b. Frequency and duration of service: Effective 09/28/17 92mo1, 68mo2, 18mo1, 4 prns for complications  with disease process/progression, medication changes or concerns . Visits/Contact may  be conducted telephonically or in person to best suit the patient.  c. Activity restrictions: Pt may be up as tolerated and can safely ambulate without the need for a assistive device   d. Safety Measures: Standard Precautions/Infection Control   e. Service Objectives and Goals: Service Objectives are to assist the pt with HIV medication regimen adherence and staying in care with the Infectious Disease Clinic by identifying barriers to care.  RN will address the barriers that are identified by the patient. Marie Olson would like to have another child and stay in care on on her medications.  Her daughter has fallen down the stairs of their apartment  several times and they would like to relocate to a one story home due to safety concerns.  f. Equipment required: No additional equipment needs at this time   g. Functional Limitations: None noted  h. Rehabilitation potential: Guarded   i. Diet and Nutritional Needs: Regular Diet   j. Medications and treatments: Medications have been reconciled and reviewed and are a part of EPIC electronic file   k. Specific therapies if needed: RN   l. Pertinent diagnoses: HIV disease,  Hx of medication NonCompliance   m. Expected outcome: Guarded

## 2017-09-30 NOTE — Telephone Encounter (Signed)
I acknowledge and approve of this care plan 

## 2017-10-06 NOTE — Progress Notes (Unsigned)
Traveled to Alexie and Arianne's home and was able to collect the paystubs today. After receiving the paystubs I traveled to [facility], made copies and returned the original to the patient. I traveled to [employer] on the patient's behalf and was able to get the organization to pay 558.00 dollars to cover the cost of the light bill. The lights were due to be disconnected today. I then traveled to [employer]

## 2017-10-06 NOTE — Progress Notes (Signed)
Home visit made to take a prepared letter requesting a copy of the husband's paystubs. The plan is for husband to take the letter with him when he goes to work this afternoon. The paystubs are needed to reinstate food stamps, for his daughter's disability application, request assistance with the light bill, emergency medication assistance and ADAP application. Tinnie and I discussed her desire to have additional children. I explained to her that I would love to help her and our 1st goal must be to get her and her husband back on their medications consistently. She verbalized understanding.

## 2017-10-11 NOTE — Progress Notes (Signed)
Home visit made today and during this visit the discussion is around medication adherence for the family. Currently each family member has all of their medications and we will f/u again on their adherence

## 2017-10-13 ENCOUNTER — Telehealth: Payer: Self-pay | Admitting: *Deleted

## 2017-10-13 ENCOUNTER — Encounter: Payer: Self-pay | Admitting: Infectious Disease

## 2017-10-13 ENCOUNTER — Other Ambulatory Visit: Payer: Medicaid Other

## 2017-10-13 ENCOUNTER — Other Ambulatory Visit: Payer: Self-pay

## 2017-10-13 ENCOUNTER — Other Ambulatory Visit (HOSPITAL_COMMUNITY)
Admission: RE | Admit: 2017-10-13 | Discharge: 2017-10-13 | Disposition: A | Discharge status: Home / Self Care | Payer: Medicaid Other | Source: Ambulatory Visit | Attending: Infectious Disease | Admitting: Infectious Disease

## 2017-10-13 DIAGNOSIS — B20 Human immunodeficiency virus [HIV] disease: Secondary | ICD-10-CM | POA: Insufficient documentation

## 2017-10-13 DIAGNOSIS — E46 Unspecified protein-calorie malnutrition: Secondary | ICD-10-CM

## 2017-10-13 HISTORY — DX: Unspecified protein-calorie malnutrition: E46

## 2017-10-13 NOTE — Telephone Encounter (Signed)
Added to problem list.

## 2017-10-13 NOTE — Telephone Encounter (Signed)
Weight /BMI 09/01/2017  WEIGHT 95 lb 8 oz  HEIGHT   BMI 18.65 kg/m2   Patient will need a diagnosis related to malnutrition or weight loss on their problem list in order to continue on Ensure via THP. Please advise. Andree CossHowell, Maeven Mcdougall M, RN

## 2017-10-14 LAB — COMPLETE METABOLIC PANEL WITH GFR
AG Ratio: 1.1 (calc) (ref 1.0–2.5)
ALBUMIN MSPROF: 4.4 g/dL (ref 3.6–5.1)
ALKALINE PHOSPHATASE (APISO): 55 U/L (ref 33–115)
ALT: 8 U/L (ref 6–29)
AST: 21 U/L (ref 10–30)
BUN: 18 mg/dL (ref 7–25)
CALCIUM: 9.3 mg/dL (ref 8.6–10.2)
CO2: 27 mmol/L (ref 20–32)
CREATININE: 0.8 mg/dL (ref 0.50–1.10)
Chloride: 103 mmol/L (ref 98–110)
GFR, EST NON AFRICAN AMERICAN: 100 mL/min/{1.73_m2} (ref >=60)
GFR, Est African American: 115 mL/min/{1.73_m2} (ref >=60)
GLOBULIN: 4 g/dL — AB (ref 1.9–3.7)
Glucose, Bld: 88 mg/dL (ref 65–99)
POTASSIUM: 3.8 mmol/L (ref 3.5–5.3)
SODIUM: 138 mmol/L (ref 135–146)
Total Bilirubin: 0.5 mg/dL (ref 0.2–1.2)
Total Protein: 8.4 g/dL — ABNORMAL HIGH (ref 6.1–8.1)

## 2017-10-14 LAB — CBC WITH DIFFERENTIAL/PLATELET
BASOS ABS: 40 {cells}/uL (ref 0–200)
Basophils Relative: 0.9 %
EOS PCT: 17.8 %
Eosinophils Absolute: 783 cells/uL — ABNORMAL HIGH (ref 15–500)
HEMATOCRIT: 34.6 % — AB (ref 35.0–45.0)
HEMOGLOBIN: 12.2 g/dL (ref 11.7–15.5)
LYMPHS ABS: 1857 {cells}/uL (ref 850–3900)
MCH: 30.5 pg (ref 27.0–33.0)
MCHC: 35.3 g/dL (ref 32.0–36.0)
MCV: 86.5 fL (ref 80.0–100.0)
MPV: 10.9 fL (ref 7.5–12.5)
Monocytes Relative: 10.4 %
NEUTROS PCT: 28.7 %
Neutro Abs: 1263 cells/uL — ABNORMAL LOW (ref 1500–7800)
Platelets: 245 10*3/uL (ref 140–400)
RBC: 4 10*6/uL (ref 3.80–5.10)
RDW: 13.1 % (ref 11.0–15.0)
Total Lymphocyte: 42.2 %
WBC: 4.4 10*3/uL (ref 3.8–10.8)
WBCMIX: 458 {cells}/uL (ref 200–950)

## 2017-10-14 LAB — URINE CYTOLOGY ANCILLARY ONLY
Chlamydia: NEGATIVE
NEISSERIA GONORRHEA: NEGATIVE

## 2017-10-14 LAB — T-HELPER CELL (CD4) - (RCID CLINIC ONLY)
CD4 % Helper T Cell: 19 % — ABNORMAL LOW (ref 33–55)
CD4 T Cell Abs: 440 /uL (ref 400–2700)

## 2017-10-14 LAB — RPR: RPR: NONREACTIVE

## 2017-10-15 ENCOUNTER — Telehealth: Payer: Self-pay | Admitting: *Deleted

## 2017-10-15 LAB — HIV-1 RNA QUANT-NO REFLEX-BLD
HIV 1 RNA QUANT: NOT DETECTED {copies}/mL
HIV-1 RNA Quant, Log: <1.3 Log copies/mL

## 2017-10-15 NOTE — Telephone Encounter (Signed)
Contacted [facility] and they have not filled the patient's medications. Spoke with [facility] and the patient's medications were last filled by [facility] which would have been mailed to the patient with 5 refills. 1st refill was filled in May.   Contacted Education officer, communityacific Interpreter for [employer] interpreter and spoke with [name] who stated she did not get her medications for June.   Contacted [facility] [place]: [phone] and the patient's meds are being sent to an old address. Meds were shipped on May 8th and May 29th to the old address. Updated the patient's address at this time and [facility] her medications so she should receive her next refill on Mon or Tues of next week. (Today is Friday).   Contacted Education officer, communityacific Interpreter for [employer] interpreter/Rosemary and let [name] know that her medications may still be at the old address (She will send her husband to check) and I have updated her address so she will also receive medications on Mon or Tues at her current address. I also rescheduled her appt from the 26th of June to the 28th of June so the couple can both come on the same date and time to see Dr Daiva EvesVan Dam.

## 2017-10-26 ENCOUNTER — Ambulatory Visit: Payer: Self-pay | Admitting: *Deleted

## 2017-10-26 DIAGNOSIS — Z789 Other specified health status: Secondary | ICD-10-CM

## 2017-10-26 DIAGNOSIS — B20 Human immunodeficiency virus [HIV] disease: Secondary | ICD-10-CM

## 2017-10-26 DIAGNOSIS — Z9119 Patient's noncompliance with other medical treatment and regimen: Secondary | ICD-10-CM

## 2017-10-26 DIAGNOSIS — Z91199 Patient's noncompliance with other medical treatment and regimen due to unspecified reason: Secondary | ICD-10-CM

## 2017-10-27 ENCOUNTER — Ambulatory Visit: Payer: Medicaid Other | Admitting: Infectious Disease

## 2017-10-29 ENCOUNTER — Encounter: Payer: Self-pay | Admitting: Infectious Disease

## 2017-10-29 ENCOUNTER — Ambulatory Visit (INDEPENDENT_AMBULATORY_CARE_PROVIDER_SITE_OTHER): Payer: Self-pay | Admitting: Infectious Disease

## 2017-10-29 VITALS — BP 131/78 | HR 88 | Temp 97.7°F | Wt 98.0 lb

## 2017-10-29 DIAGNOSIS — B20 Human immunodeficiency virus [HIV] disease: Secondary | ICD-10-CM

## 2017-10-29 DIAGNOSIS — B181 Chronic viral hepatitis B without delta-agent: Secondary | ICD-10-CM

## 2017-10-29 MED ORDER — DARUNAVIR-COBICISTAT 800-150 MG PO TABS: 1.0000 | ORAL_TABLET | Freq: Every day | ORAL | 11 refills | Status: DC

## 2017-10-29 MED ORDER — EMTRICITABINE-TENOFOVIR AF 200-25 MG PO TABS: 1.0000 | ORAL_TABLET | Freq: Every day | ORAL | 11 refills | Status: DC

## 2017-10-29 NOTE — Progress Notes (Signed)
Chief complaint:  Here for followup for HIV  Subjective:    Patient ID: Marie Olson, female    DOB: Dec 18, 1990, 27 y.o.   MRN: [id number]  HPI  27 year old Swahili speaking African lady with newly diagnosed HIV, here with HIV + husband . She had fairly high viral load and CD4 just above 200 when we first checked it.  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   We tried Genvoya for her but due to her terrible compliance changed her over to PI based therapy.  And she continue to show poor adherence with this regimen leading me to change her over to [facility] and DESCOVY and she responded dramatically was free happy with the new regimen. She also became undetectable and her CD4 count improved.   She then came off of her medications.   She and her husband were also  trying to have another child . She and he are ready have an HIV-positive child which they bring to [facility].   Now that we had new data with regards t DTG and potential neural due tube defects I wanted to have her off of a DTG regimen and reinstitute at PI based regimen and I sent SYMTUZA to [facility]. This was picked up but I have had suspicion that it was actually taken by her husband instead. We then placed her on PRezcobix and Descovy but she did not tolerate this well. She was ultimately changed to Isentress BID and Descovy. She however began taking this with ONLY ONE ISENTRESS and one Descovy per day x past 2 months prior to her last visit with me.  Interestingly her virus was relatively suppressed still.  I had thought she had not applied for a HMAP but in fact she and her husband had but it never been processed in [place] according to [place] when she reviewed the records today.  In any case I was concerned about the urological failure with resistance and I changed her to [facility] which she has been taking since then.  Her viral load on repeat check was less  than 20.  She seems to have been taking SYMTUZA that we gave her in the form of a bottle from supply.  She does not seem altogether where the fact that she needs to renew her HMA P program.        Past Medical History:  Diagnosis Date  . Abscess   . 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 Dysphagia 04/08/2016  . HIV (human immunodeficiency virus infection) (HCC)   . Housing problems 12/24/2014  . Hypertension   . Malnutrition (HCC) 10/13/2017  . Symptoms of upper respiratory infection (URI) 04/08/2016    Past Surgical History:  Procedure Laterality Date  . DIAGNOSTIC LAPAROSCOPY WITH REMOVAL OF ECTOPIC PREGNANCY N/A 06/03/2015   Procedure: DIAGNOSTIC LAPAROSCOPY,  Left Salpingectomy with  REMOVAL OF ECTOPIC PREGNANCY;  Surgeon: Adam Phenix, MD;  Location: [facility];  Service: Gynecology;  Laterality: N/A;    Family History  Family history unknown: Yes      Social History   Socioeconomic History  . Marital status: Married    Spouse name: Not on file  . Number of children: Not on file  . Years of education: Not on file  . Highest education level: Not on file  Occupational History  . Not on file  Social Needs  . Physicist, medical  strain: Not on file  . Food insecurity:    Worry: Not on file    Inability: Not on file  . Transportation needs:    Medical: Not on file    Non-medical: Not on file  Tobacco Use  . Smoking status: Never Smoker  . Smokeless tobacco: Never Used  Substance and Sexual Activity  . Alcohol use: No    Alcohol/week: 0.0 oz  . Drug use: No  . Sexual activity: Yes    Partners: Male    Birth control/protection: None    Comment: pt declined condoms  Lifestyle  . Physical activity:    Days per week: Not on file    Minutes per session: Not on file  . Stress: Not on file  Relationships  . Social connections:    Talks on phone: Not on file    Gets together: Not on file    Attends religious service: Not  on file    Active member of club or organization: Not on file    Attends meetings of clubs or organizations: Not on file    Relationship status: Not on file  Other Topics Concern  . Not on file  Social History Narrative  . Not on file    No Known Allergies   Current Outpatient Medications:  .  Darunavir-Cobicisctat-Emtricitabine-Tenofovir Alafenamide (SYMTUZA) 800-150-200-10 MG TABS, Take 1 tablet by mouth daily with breakfast., Disp: 30 tablet, Rfl: 5 .  darunavir-cobicistat (PREZCOBIX) 800-150 MG tablet, Take 1 tablet by mouth daily., Disp: 30 tablet, Rfl: 11 .  emtricitabine-tenofovir AF (DESCOVY) 200-25 MG tablet, Take 1 tablet by mouth daily., Disp: 30 tablet, Rfl: 11   Review of Systems  Constitutional: Negative for activity change, chills, diaphoresis, fatigue, fever and unexpected weight change.  HENT: Negative for congestion, rhinorrhea, sinus pressure, sneezing, sore throat and trouble swallowing.   Eyes: Negative for photophobia and visual disturbance.  Respiratory: Negative for cough, chest tightness, shortness of breath, wheezing and stridor.   Cardiovascular: Negative for chest pain, palpitations and leg swelling.  Gastrointestinal: Negative for abdominal distention, abdominal pain, anal bleeding, blood in stool, constipation and diarrhea.  Genitourinary: Negative for difficulty urinating, dysuria, flank pain and hematuria.  Musculoskeletal: Negative for arthralgias, back pain, gait problem, joint swelling and myalgias.  Skin: Negative for color change, pallor, rash and wound.  Neurological: Negative for tremors, weakness and light-headedness.  Hematological: Negative for adenopathy. Does not bruise/bleed easily.  Psychiatric/Behavioral: Negative for agitation, behavioral problems, confusion, decreased concentration, dysphoric mood and sleep disturbance.       Objective:   Physical Exam  Constitutional: She is oriented to person, place, and time. She appears  well-nourished. No distress.  HENT:  Head: Normocephalic and atraumatic.  Mouth/Throat: Uvula is midline and oropharynx is clear and moist. No oropharyngeal exudate.  Eyes: Conjunctivae and EOM are normal. No scleral icterus.  Neck: Normal range of motion. Neck supple.  Cardiovascular: Normal rate and regular rhythm.  Pulmonary/Chest: Effort normal. No respiratory distress. She has no wheezes.  Abdominal: She exhibits no distension.  Musculoskeletal: She exhibits no edema.  Neurological: She is alert and oriented to person, place, and time. She exhibits normal muscle tone. Coordination normal.  Skin: Skin is warm and dry. No rash noted. She is not diaphoretic. No erythema. No pallor.  Psychiatric: She has a normal mood and affect. Her behavior is normal. Judgment and thought content normal.          Assessment & Plan:   HIV/AIDS:   I gave  her 2 bottles of SYMTUZA.  I have asked her and her husband to come back to renew their HMA P program since [facility] likely to be on the H and AP formulary she will likely need to go on to PREZCOBIX and DESCOVY through HMA P.   Chronic hepatitis B without delta agent and without hepatic coma: To be on covered with TAF/FTC or TDF/FTC  I spent greater than 25 minutes with the patient including greater than 50% of time in face to face counsel of the patient and her husband re need to be highly adherent to ARV, troubleshooting her medicaitons and in coordination of her care. e.

## 2017-10-29 NOTE — Progress Notes (Signed)
Just to recap On 6/14 [facility] [phone] and the patient's meds(Symtuza) have been sent to an old address. Meds were shipped on May 8th and May 29th to the old address. Updated the patient's address at this time [facility] will [facility] her medications so she should receive her next refill on Mon(06/17) or Tues(06/18) of next week.  Contacted Shaqueena with the update and she stated she will have her husband pick the medications up. I made her aware that if the medications are not at the old address that is ok because she will be receiving another shipment of her medications on 6/17 or 6/18.     Today (06/25) Home visit made with [name] and through [employer]pre[employer] I asked [name] if her husband was able to pick up her Symtuza from her old address.  [name] stated she was not sure and contacted her husband by phone. He was unable to understand what I was asking so I just asked [name] if she received her medications through the mail last week. She stated she did receive the Symtuza by mail at her current address. [name] stated she has been feeling well and have been taking her medications daily. She did question a bill for lab work that was performed at the clinic. I advised her that she must renew her ADAP coverage in January and July/August every year so that she can have her medications covered and office visits paid for. She verbalized understanding. I informed her that at her next visit (06/26) she needs to renew her ADAP. Her husband's ADAP application is already being processed. Michelle Evans/[facility] Artistinancial Counselor  just needed a copy of his paystubs which we were able to get provide.

## 2017-11-12 ENCOUNTER — Encounter: Payer: Self-pay | Admitting: Infectious Disease

## 2017-12-01 ENCOUNTER — Ambulatory Visit: Payer: Medicaid Other

## 2017-12-15 ENCOUNTER — Telehealth: Payer: Self-pay | Admitting: *Deleted

## 2017-12-15 NOTE — Telephone Encounter (Signed)
Initial Assessment Points to Consider for Care  Swahili Speaking Patient  Points are not all inclusive to services and educated provided but supports the patient's Individualized Plan of Care.  . Is Home safe for visits? Yes  / Are all firearms or weapons secure? Yes . Insurance Coverage: MCD . 1st HIV Diagnosis: 06/29/14 . Mode of HIV Transmission: Heterosexual Contact . Functional Status: no limitations noted . Current Housing/Needs: single story housing due to ambulation difficulties/fall  of her dtr . Social Support/System: appears to be in place . Culture/Religion/Spirituality: not discussed . Educational Background: raised in [place] an [place] . Legal Issues: none noted . Access/Utilization of Community Resources: able to assess and engage with [facility] . Mental Health Concerns/Diagnosis: none noted or documented . Alcohol and/or Drug Use: none noted or documented . Risk and Knowledge of HIV and Reduction in Transmission: is questionable and will be addressed. Patient speaks swahili and used a personal friend for translation so HIV status was not addressed . Nutritional Needs: none noted    Frequency / Duration of CBHCN visits: Effective:12/28/17 371mo1, 92mo2,1mo1   4 PRN's for complications with disease process/progression, medication changes or concerns   [facility]N will assess for learning needs related to diagnosis and treatment regimen, provide education as needed, fill pill box if needed, address any barriers which may be preventing medication compliance, and communicating with care team including physician and case manager.   Individualized Plan Of Care Certification Period from 12/28/17 to 03/28/18  a. Type of service(s) and care to be delivered: RN Case Management  b. Frequency and duration of service: Effective 12/28/17 261mo1, 302mo2, 561mo1, 4 prns for complications  with disease process/progression, medication changes or concerns . Visits/Contact may  be conducted telephonically or in person to best suit the patient.  c. Activity restrictions: Pt may be up as tolerated and can safely ambulate without the need for a assistive device   d. Safety Measures: Standard Precautions/Infection Control   e. Service Objectives and Goals: Service Objectives are to assist the pt with HIV medication regimen adherence and staying in care with the Infectious Disease Clinic by identifying barriers to care.  RN will address the barriers that are identified by the patient. [name] would like to have another child and stay in care on on her medications.  Her daughter has fallen down the stairs of their apartment  several times and they would like to relocate to a one story home due to safety concerns.  f. Equipment required: No additional equipment needs at this time   g. Functional Limitations: None noted  h. Rehabilitation potential: Guarded   i. Diet and Nutritional Needs: Regular Diet   j. Medications and treatments: Medications have been reconciled and reviewed and are a part of EPIC electronic file   k. Specific therapies if needed: RN   l. Pertinent diagnoses: HIV disease,  Hx of medication NonCompliance   m. Expected outcome: Guarded

## 2018-01-10 ENCOUNTER — Ambulatory Visit: Payer: Medicaid Other

## 2018-01-18 ENCOUNTER — Emergency Department (HOSPITAL_COMMUNITY)
Admission: EM | Admit: 2018-01-18 | Discharge: 2018-01-18 | Disposition: A | Discharge status: Home / Self Care | Payer: Medicaid Other | Attending: Emergency Medicine | Admitting: Emergency Medicine

## 2018-01-18 ENCOUNTER — Encounter (HOSPITAL_COMMUNITY): Payer: Self-pay | Admitting: Emergency Medicine

## 2018-01-18 DIAGNOSIS — Y999 Unspecified external cause status: Secondary | ICD-10-CM | POA: Insufficient documentation

## 2018-01-18 DIAGNOSIS — I10 Essential (primary) hypertension: Secondary | ICD-10-CM | POA: Insufficient documentation

## 2018-01-18 DIAGNOSIS — Y929 Unspecified place or not applicable: Secondary | ICD-10-CM | POA: Insufficient documentation

## 2018-01-18 DIAGNOSIS — Y93G1 Activity, food preparation and clean up: Secondary | ICD-10-CM | POA: Insufficient documentation

## 2018-01-18 DIAGNOSIS — W260XXA Contact with knife, initial encounter: Secondary | ICD-10-CM | POA: Insufficient documentation

## 2018-01-18 DIAGNOSIS — Z79899 Other long term (current) drug therapy: Secondary | ICD-10-CM | POA: Insufficient documentation

## 2018-01-18 DIAGNOSIS — S61412A Laceration without foreign body of left hand, initial encounter: Secondary | ICD-10-CM | POA: Insufficient documentation

## 2018-01-18 DIAGNOSIS — Z21 Asymptomatic human immunodeficiency virus [HIV] infection status: Secondary | ICD-10-CM | POA: Insufficient documentation

## 2018-01-18 NOTE — ED Notes (Signed)
Patient able to ambulate independently  

## 2018-01-18 NOTE — Discharge Instructions (Addendum)
Wound recheck with your doctor in 2 days, return to ER for worsening or concerning symptoms.

## 2018-01-18 NOTE — ED Notes (Signed)
Wound care done with sterile water and iodine.  Steri-strips applied and wrapped in loose gauze.  Patient verbalized understanding of all discharge instructions.  Swahili interpreter used.

## 2018-01-18 NOTE — ED Triage Notes (Signed)
Pt presents to ED for assessment of laceration with EMS on left hand from a knife where she slipped while cutting pineapple.

## 2018-01-18 NOTE — ED Provider Notes (Signed)
[facility] Provider Note   CSN: [id number] Arrival date & time: 01/18/18  1946     History   Chief Complaint Chief Complaint  Patient presents with  . Laceration    HPI Marie Olson is a 27 y.o. Olson.  27 yo Olson brought in by EMS for laceration to the left hand. Patient speaks Swahili, translator was used for history and physical today.  Patient was cutting fruit when she accidentally cut the palm of her left hand.  Bleeding is controlled.  Immunizations are up-to-date.  Denies numbness in her finger, pain or difficulty moving the thumb or hand.  No other injuries, complaints, concerns.     Past Medical History:  Diagnosis Date  . Abscess   . 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 Dysphagia 04/08/2016  . HIV (human immunodeficiency virus infection) (HCC)   . Housing problems 12/24/2014  . Hypertension   . Malnutrition (HCC) 10/13/2017  . Symptoms of upper respiratory infection (URI) 04/08/2016    Patient Active Problem List   Diagnosis Date Noted  . Malnutrition (HCC) 10/13/2017  . Dysphagia 04/08/2016  . Symptoms of upper respiratory infection (URI) 04/08/2016  . Noncompliance 10/03/2015  . Ectopic pregnancy, tubal 06/04/2015  . Acute blood loss anemia 06/04/2015  . Vaginal bleeding in pregnancy   . Housing problems 12/24/2014  . 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 Axillary hidradenitis suppurativa 09/05/2014  . Tachycardia   . HIV disease (HCC)   . Abscess and cellulitis 08/26/2014  . Abscess 08/26/2014  . Refugee health examination 07/07/2014  . Immigrant with language difficulty 07/07/2014  . Palpitations 07/07/2014    Past Surgical History:  Procedure Laterality Date  . DIAGNOSTIC LAPAROSCOPY WITH REMOVAL OF ECTOPIC PREGNANCY N/A 06/03/2015   Procedure: DIAGNOSTIC LAPAROSCOPY,  Left Salpingectomy with  REMOVAL OF ECTOPIC  PREGNANCY;  Surgeon: Adam PhenixJames G Arnold, MD;  Location: [facility];  Service: Gynecology;  Laterality: N/A;     OB History    Gravida  4   Para  3   Term  3   Preterm      AB  1   Living  3     SAB      TAB      Ectopic      Multiple      Live Births               Home Medications    Prior to Admission medications   Medication Sig Start Date End Date Taking? Authorizing Provider  Darunavir-Cobicisctat-Emtricitabine-Tenofovir Alafenamide [facility](SYMTUZA) 800-150-200-10 MG TABS Take 1 tablet by mouth daily with breakfast. 09/01/17   Daiva EvesVan Dam, Lisette Grinderornelius N, MD  darunavir-cobicistat (PREZCOBIX) 800-150 MG tablet Take 1 tablet by mouth daily. 10/29/17   Randall HissVan Dam, Cornelius N, MD  emtricitabine-tenofovir AF (DESCOVY) 200-25 MG tablet Take 1 tablet by mouth daily. 10/29/17   Randall HissVan Dam, Cornelius N, MD    Family History Family History  Family history unknown: Yes    Social History Social History   Tobacco Use  . Smoking status: Never Smoker  . Smokeless tobacco: Never Used  Substance Use Topics  . Alcohol use: No    Alcohol/week: 0.0 standard drinks  . Drug use: No     Allergies   Patient has no known allergies.   Review of Systems Review of Systems  Musculoskeletal: Negative for arthralgias and myalgias.  Skin: Positive for wound.  Allergic/Immunologic: Positive for immunocompromised state.  Neurological: Negative for weakness and numbness.  Hematological: Does not bruise/bleed easily.  Psychiatric/Behavioral: Negative for self-injury and suicidal ideas.  All other systems reviewed and are negative.    Physical Exam Updated Vital Signs BP (!) 145/91 (BP Location: Left Arm)   Pulse (!) 117   Temp 98.2 F (36.8 C) (Oral)   Resp 20   SpO2 100%   Physical Exam  Constitutional: She is oriented to person, place, and time. She appears well-developed and well-nourished. No distress.  HENT:  Head: Normocephalic and atraumatic.  Cardiovascular: Intact distal pulses.    Pulmonary/Chest: Effort normal.  Musculoskeletal: Normal range of motion. She exhibits no tenderness or deformity.       Left hand: She exhibits laceration. She exhibits normal range of motion, no tenderness, no bony tenderness, normal capillary refill and no swelling. Normal sensation noted. Normal strength noted.       Hands: Neurological: She is alert and oriented to person, place, and time.  Skin: Skin is warm and dry. No rash noted. She is not diaphoretic.  Psychiatric: She has a normal mood and affect. Her behavior is normal.  Nursing note and vitals reviewed.    ED Treatments / Results  Labs (all labs ordered are listed, but only abnormal results are displayed) Labs Reviewed - No data to display  EKG None  Radiology No results found.  Procedures Procedures (including critical care time)  Medications Ordered in ED Medications - No data to display   Initial Impression / Assessment and Plan / ED Course  I have reviewed the triage vital signs and the nursing notes.  Pertinent labs & imaging results that were available during my care of the patient were reviewed by me and considered in my medical decision making (see chart for details).  Clinical Course as of Jan 18 2022  Tue Jan 18, 2018  6545 27 year old Olson with laceration to the left hand.  Bleeding was controlled upon EMS arrival today, small wound not requiring sutures.  Wound closed with a Steri-Strip and dressing placed.   [LM]    Clinical Course User Index [LM] Jeannie Fend, PA-C    Final Clinical Impressions(s) / ED Diagnoses   Final diagnoses:  Laceration of left hand without foreign body, initial encounter    ED Discharge Orders    None       Alden Hipp 01/18/18 2023    Raeford Razor, MD 01/27/18 1329

## 2018-01-24 ENCOUNTER — Ambulatory Visit (INDEPENDENT_AMBULATORY_CARE_PROVIDER_SITE_OTHER): Payer: Self-pay | Admitting: Pharmacist

## 2018-01-24 DIAGNOSIS — B20 Human immunodeficiency virus [HIV] disease: Secondary | ICD-10-CM

## 2018-01-24 DIAGNOSIS — R112 Nausea with vomiting, unspecified: Secondary | ICD-10-CM

## 2018-01-24 MED ORDER — ONDANSETRON 4 MG PO TBDP: 4.0000 mg | ORAL_TABLET | Freq: Three times a day (TID) | ORAL | 2 refills | Status: DC | PRN

## 2018-01-24 MED ORDER — DARUN-COBIC-EMTRICIT-TENOFAF 800-150-200-10 MG PO TABS: 1.0000 | ORAL_TABLET | Freq: Every day | ORAL | 5 refills | Status: AC

## 2018-01-24 NOTE — Progress Notes (Signed)
HPI: Marie Olson is a 27 y.o. female who presents to the [facility] pharmacy clinic for her follow-up HIV visit. [name] only speaks Swahili, so an interpreter was used for this visit.  Patient Active Problem List   Diagnosis Date Noted  . Malnutrition (HCC) 10/13/2017  . Dysphagia 04/08/2016  . Symptoms of upper respiratory infection (URI) 04/08/2016  . Noncompliance 10/03/2015  . Ectopic pregnancy, tubal 06/04/2015  . Acute blood loss anemia 06/04/2015  . Vaginal bleeding in pregnancy   . Housing problems 12/24/2014  . 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 Axillary hidradenitis suppurativa 09/05/2014  . Tachycardia   . HIV disease (HCC)   . Abscess and cellulitis 08/26/2014  . Abscess 08/26/2014  . Refugee health examination 07/07/2014  . Immigrant with language difficulty 07/07/2014  . Palpitations 07/07/2014    Patient's Medications  New Prescriptions   No medications on file  Previous Medications   DARUNAVIR-COBICISCTAT-EMTRICITABINE-TENOFOVIR ALAFENAMIDE (SYMTUZA) 800-150-200-10 MG TABS    Take 1 tablet by mouth daily with breakfast.   DARUNAVIR-COBICISTAT (PREZCOBIX) 800-150 MG TABLET    Take 1 tablet by mouth daily.   EMTRICITABINE-TENOFOVIR AF (DESCOVY) 200-25 MG TABLET    Take 1 tablet by mouth daily.  Modified Medications   No medications on file  Discontinued Medications   No medications on file    Allergies: No Known Allergies  Past Medical History: Past Medical History:  Diagnosis Date  . Abscess   . 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 Dysphagia 04/08/2016  . HIV (human immunodeficiency virus infection) (HCC)   . Housing problems 12/24/2014  . Hypertension   . Malnutrition (HCC) 10/13/2017  . Symptoms of upper respiratory infection (URI) 04/08/2016    Social History: Social History   Socioeconomic History  . Marital status: Married    Spouse name: Not on file  .  Number of children: Not on file  . Years of education: Not on file  . Highest education level: Not on file  Occupational History  . Not on file  Social Needs  . Financial resource strain: Not on file  . Food insecurity:    Worry: Not on file    Inability: Not on file  . Transportation needs:    Medical: Not on file    Non-medical: Not on file  Tobacco Use  . Smoking status: Never Smoker  . Smokeless tobacco: Never Used  Substance and Sexual Activity  . Alcohol use: No    Alcohol/week: 0.0 standard drinks  . Drug use: No  . Sexual activity: Yes    Partners: Male    Birth control/protection: None    Comment: pt declined condoms  Lifestyle  . Physical activity:    Days per week: Not on file    Minutes per session: Not on file  . Stress: Not on file  Relationships  . Social connections:    Talks on phone: Not on file    Gets together: Not on file    Attends religious service: Not on file    Active member of club or organization: Not on file    Attends meetings of clubs or organizations: Not on file    Relationship status: Not on file  Other Topics Concern  . Not on file  Social History Narrative  . Not on file    Labs: Lab Results  Component Value Date   HIV1RNAQUANT <20 NOT DETECTED 10/13/2017   HIV1RNAQUANT 326 (  H) 09/01/2017   HIV1RNAQUANT 31 (H) 06/28/2017   CD4TABS 440 10/13/2017   CD4TABS 340 (L) 09/01/2017   CD4TABS 310 (L) 06/28/2017    RPR and STI Lab Results  Component Value Date   LABRPR NON-REACTIVE 10/13/2017   LABRPR NON-REACTIVE 09/01/2017   LABRPR NON-REACTIVE 01/20/2017   LABRPR NON REAC 05/13/2016   LABRPR NON REAC 12/18/2015    STI Results GC CT  10/13/2017 Negative Negative  09/01/2017 Negative Negative  12/18/2015 Negative Negative  12/11/2015 Negative Negative  10/03/2015 Negative Negative  06/03/2015 Negative Negative  04/24/2015 Negative Negative  02/13/2015 Negative Negative  12/24/2014 Negative Negative  08/17/2014 Negative Negative     Hepatitis B Lab Results  Component Value Date   HEPBSAB NEG 08/17/2014   HEPBSAG REACTIVE (A) 06/28/2017   HEPBCAB REACTIVE (A) 08/17/2014   Hepatitis C No results found for: HEPCAB, HCVRNAPCRQN Hepatitis A Lab Results  Component Value Date   HAV REACTIVE (A) 08/17/2014   Lipids: Lab Results  Component Value Date   CHOL 181 09/01/2017   TRIG 59 09/01/2017   HDL 62 09/01/2017   CHOLHDL 2.9 09/01/2017   VLDL 11 10/03/2015   LDLCALC 105 (H) 09/01/2017    Current HIV Regimen: Symtuza  Assessment: Farran reports that she has not taken any doses of her Symtuza for the past week (stopped last Monday) due to vomiting that she experiences when she takes it. She says she takes her Symtuza with food, but this does not help with preventing the vomiting. To help with this, we will send a prescription for Zofran to [facility] on [address]. I explained to her that she should take one Zofran about 30 minutes before she takes her Symtuza and that after placing it on her tongue, it should dissolve. I emphasized to her that if the Zofran doesn't help, she needs to call the clinic so we can address this and re-emphasized to her the importance of adherence. Since Eliyanah has not been taking her Symtuza for the past week and her last viral load was drawn in June, we will re-check her viral load today. She will follow-up with Dr. Daiva Eves in November.  Plan: -Continue Symtuza -Start Zofran 4 mg PO q8h PRN -HIV viral load today -F/u with Dr. Daiva Eves on 11/4 at 10:15am  Arvilla Market, PharmD PGY1 Pharmacy Resident Phone [phone] 01/24/2018     9:55 AM

## 2018-01-26 NOTE — Progress Notes (Signed)
Oh no :(

## 2018-01-31 NOTE — Progress Notes (Signed)
No, she is not pregnant. Called [facility] and they are going to see if a genotype can still be done.

## 2018-01-31 NOTE — Progress Notes (Signed)
They called me back and are able to do it, so we will see what shows up!

## 2018-02-08 LAB — HIV-1 GENOTYPING (RTI,PI,IN INHBTR)
HIV-1 Genotype: DETECTED — AB
VALUE LAST VIRAL LOAD: 38300 {copies}/mL

## 2018-02-08 LAB — HIV-1 RNA QUANT-NO REFLEX-BLD
HIV 1 RNA QUANT: 38300 {copies}/mL — AB
HIV-1 RNA QUANT, LOG: 4.58 {Log_copies}/mL — AB

## 2018-02-08 NOTE — Progress Notes (Signed)
Looks like she scooted by with no resistance

## 2018-03-07 ENCOUNTER — Ambulatory Visit (INDEPENDENT_AMBULATORY_CARE_PROVIDER_SITE_OTHER): Payer: Medicaid Other | Admitting: Infectious Diseases

## 2018-03-07 VITALS — BP 119/81 | HR 114 | Temp 97.4°F | Wt 96.0 lb

## 2018-03-07 DIAGNOSIS — B20 Human immunodeficiency virus [HIV] disease: Secondary | ICD-10-CM

## 2018-03-07 DIAGNOSIS — Z0000 Encounter for general adult medical examination without abnormal findings: Secondary | ICD-10-CM

## 2018-03-07 DIAGNOSIS — Z3169 Encounter for other general counseling and advice on procreation: Secondary | ICD-10-CM

## 2018-03-07 NOTE — Patient Instructions (Addendum)
Please return in 2 months to see Dr. Daiva Eves.   You will get your zofran (nausea medication) and symtuza medication Wednesday of this week.   Please take with a full meal and take your zofran 30 minutes before your symtuza to help with nausea.

## 2018-03-07 NOTE — Progress Notes (Signed)
Name: Marie Olson  DOB: 1991/03/25 MRN: [id number] PCP: Marthenia Rolling, DO    Patient Active Problem List   Diagnosis Date Noted  . Healthcare maintenance 03/10/2018  . Malnutrition (HCC) 10/13/2017  . Dysphagia 04/08/2016  . Noncompliance 10/03/2015  . Housing problems 12/24/2014  . 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 Tachycardia   . HIV disease (HCC)   . Encounter for preconception consultation 07/07/2014  . Immigrant with language difficulty 07/07/2014  . Palpitations 07/07/2014     Subjective:    Marie Olson  is here today for follow up for her HIV care. She is accompanied by her husband. In person interpreter was present to facilitate the visit.   She is supposed to be taking Symtuza once a day with meals. She has been out for about a month per her report because the pharmacy stopped sending the medication to her. She has not called to see why. While she took it she experienced a lot of nausea. She usually takes it before she eats food. Never got her zofran.   She has not had a pap smear ever and not interested in one. She would like to get pregnant and have another child. She has regular menstrual cycles and currently not doing anything to prevent pregnancy. Patient's last menstrual period was 03/05/2018.    Review of Systems  Constitutional: Negative for chills, fever, malaise/fatigue and weight loss.  HENT: Negative for sore throat.   Respiratory: Negative for cough and sputum production.   Cardiovascular: Negative for chest pain and leg swelling.  Gastrointestinal: Negative for abdominal pain, diarrhea and vomiting. Nausea: with symtuza   Genitourinary: Negative for dysuria and flank pain.  Musculoskeletal: Negative for joint pain, myalgias and neck pain.  Skin: Negative for rash.  Neurological: Negative for dizziness, tingling and headaches.  Psychiatric/Behavioral: Negative for depression and substance abuse. The  patient is not nervous/anxious and does not have insomnia.     Past Medical History:  Diagnosis Date  . Abscess   . 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 Dysphagia 04/08/2016  . HIV (human immunodeficiency virus infection) (HCC)   . Housing problems 12/24/2014  . Hypertension   . Malnutrition (HCC) 10/13/2017  . Symptoms of upper respiratory infection (URI) 04/08/2016    Outpatient Medications Prior to Visit  Medication Sig Dispense Refill  . Darunavir-Cobicisctat-Emtricitabine-Tenofovir Alafenamide (SYMTUZA) 800-150-200-10 MG TABS Take 1 tablet by mouth daily with breakfast. 30 tablet 5  . ondansetron (ZOFRAN ODT) 4 MG disintegrating tablet Take 1 tablet (4 mg total) by mouth every 8 (eight) hours as needed for nausea or vomiting. Take 30 minutes before your Symtuza. 30 tablet 2   No facility-administered medications prior to visit.      No Known Allergies  Social History   Tobacco Use  . Smoking status: Never Smoker  . Smokeless tobacco: Never Used  Substance Use Topics  . Alcohol use: No    Alcohol/week: 0.0 standard drinks  . Drug use: No    Family History  Family history unknown: Yes    Social History   Substance and Sexual Activity  Sexual Activity Yes  . Partners: Male  . Birth control/protection: None   Comment: pt declined condoms     Objective:   Vitals:   03/07/18 1046  BP: 119/81  Pulse: (!) 114  Temp: (!) 97.4 F (36.3 C)  Weight: 96 lb (43.5 kg)   Body mass index  is 18.75 kg/m.  Physical Exam  Constitutional: She is oriented to person, place, and time. She appears well-developed and well-nourished.  Seated comfortably in chair.   HENT:  Mouth/Throat: Mucous membranes are normal. No oral lesions. Normal dentition. No dental abscesses. No oropharyngeal exudate.  Eyes: Pupils are equal, round, and reactive to light. No scleral icterus.  Cardiovascular: Regular rhythm, normal heart sounds and normal  pulses. Tachycardia present.  No murmur heard. Pulmonary/Chest: Effort normal and breath sounds normal.  Abdominal: Soft. She exhibits no distension. There is no tenderness.  Musculoskeletal: Normal range of motion.  Lymphadenopathy:    She has no cervical adenopathy.  Neurological: She is alert and oriented to person, place, and time.  Skin: Skin is warm and dry. No rash noted.  Psychiatric: She has a normal mood and affect. Judgment normal.  In good spirits today   Vitals reviewed.   Lab Results Lab Results  Component Value Date   WBC 4.4 10/13/2017   HGB 12.2 10/13/2017   HCT 34.6 (L) 10/13/2017   MCV 86.5 10/13/2017   PLT 245 10/13/2017    Lab Results  Component Value Date   CREATININE 0.80 10/13/2017   CREATININE 0.89 09/01/2017   CREATININE 0.75 06/28/2017   Lab Results  Component Value Date   ALT 8 10/13/2017   AST 21 10/13/2017   ALKPHOS 48 05/13/2016   BILITOT 0.5 10/13/2017    HIV 1 RNA Quant (copies/mL)  Date Value  01/24/2018 38,300 (H)  10/13/2017 <20 NOT DETECTED  09/01/2017 326 (H)   CD4 T Cell Abs (/uL)  Date Value  10/13/2017 440  09/01/2017 340 (L)  06/28/2017 310 (L)     Assessment & Plan:   Problem List Items Addressed This Visit      Unprioritized   Encounter for preconception consultation    She has not had durable viral control and has been out of medications again for a month now. Long discussion about risk of transferring infection to fetus if not controlled and recommendations for c/secion if VL > 1000 or concern with adherence. I asked her to please start taking folic acid supplement daily now in preparation for pregnancy. Her ART should be changed based on guidelines to Prezista + Norvir + Truvada however I am not confident she will do well with 3 pill regimen.       Healthcare maintenance    She declines pap smear. Counseled and education provided. Flu and prevnar vaccines today.      HIV disease (HCC)    She is off ART  presently d/t problem with pharmacy dispensing. Kathie Rhodes looked into this and apparently they have been trying to reach the patient to confirm mailing address (patient moved not long ago) but never heard back from her. Has been corrected so she will get a delivery on Wednesday. No s/sx suggesting OI today. Reviewed labs and explained that she is at risk for infections, cancers, organ dysfunction and death should she not get this under control. Will have her come back in 2 months and check labs then after she gets re-established on medications.          Rexene Alberts, MSN, NP-C [facility] Pager: [phone] Office: [phone]  03/10/18  10:31 PM

## 2018-03-10 DIAGNOSIS — Z0000 Encounter for general adult medical examination without abnormal findings: Secondary | ICD-10-CM | POA: Insufficient documentation

## 2018-03-10 NOTE — Assessment & Plan Note (Signed)
She is off ART presently d/t problem with pharmacy dispensing. Kathie Rhodes looked into this and apparently they have been trying to reach the patient to confirm mailing address (patient moved not long ago) but never heard back from her. Has been corrected so she will get a delivery on Wednesday. No s/sx suggesting OI today. Reviewed labs and explained that she is at risk for infections, cancers, organ dysfunction and death should she not get this under control. Will have her come back in 2 months and check labs then after she gets re-established on medications.

## 2018-03-10 NOTE — Assessment & Plan Note (Addendum)
She declines pap smear. Counseled and education provided. Flu and prevnar vaccines today.

## 2018-03-10 NOTE — Assessment & Plan Note (Addendum)
She has not had durable viral control and has been out of medications again for a month now. Long discussion about risk of transferring infection to fetus if not controlled and recommendations for c/secion if VL > 1000 or concern with adherence. I asked her to please start taking folic acid supplement daily now in preparation for pregnancy. Her ART should be changed based on guidelines to Prezista + Norvir + Truvada however I am not confident she will do well with 3 pill regimen.

## 2018-03-25 ENCOUNTER — Ambulatory Visit: Payer: Self-pay | Admitting: *Deleted

## 2018-03-25 ENCOUNTER — Telehealth: Payer: Self-pay | Admitting: *Deleted

## 2018-03-25 DIAGNOSIS — Z789 Other specified health status: Secondary | ICD-10-CM

## 2018-03-25 DIAGNOSIS — B20 Human immunodeficiency virus [HIV] disease: Secondary | ICD-10-CM

## 2018-03-25 NOTE — Telephone Encounter (Signed)
Initial Assessment Points to Consider for Care  Swahili Speaking Patient  Points are not all inclusive to services and educated provided but supports the patient's Individualized Plan of Care.  . Is Home safe for visits? Yes  / Are all firearms or weapons secure? Yes . Insurance Coverage: MCD . 1st HIV Diagnosis: 06/29/14 . Mode of HIV Transmission: Heterosexual Contact . Functional Status: no limitations noted . Current Housing/Needs: single story housing due to ambulation difficulties/fall  of her dtr . Social Support/System: appears to be in place . Culture/Religion/Spirituality: not discussed . Educational Background: raised in [place]/immigrated to the [place] as an adult . Legal Issues: none noted . Access/Utilization of Community Resources: able to assess and engage with [facility] . Mental Health Concerns/Diagnosis: none noted or documented . Alcohol and/or Drug Use: none noted or documented . Risk and Knowledge of HIV and Reduction in Transmission: is questionable and will be addressed. Patient speaks swahili and used a personal friend for translation so HIV status was not addressed . Nutritional Needs: none noted    Frequency / Duration of [facility] visits: Effective:03/29/18 111mo1, 582mo2,1mo1   4 PRN's for complications with disease process/progression, medication changes or concerns   [facility] will assess for learning needs related to diagnosis and treatment regimen, provide education as needed, fill pill box if needed, address any barriers which may be preventing medication compliance, and communicating with care team including physician and case manager.   Individualized Plan Of Care Certification Period from 03/29/18 to 06/27/18  a. Type of service(s) and care to be delivered: RN Case Management  b. Frequency and duration of service: Effective 03/29/18 591mo1, 422mo2, 411mo1, 4 prns for complications  with disease process/progression, medication changes or concerns . Visits/Contact may  be conducted telephonically or in person to best suit the patient.  c. Activity restrictions: Pt may be up as tolerated and can safely ambulate without the need for a assistive device   d. Safety Measures: Standard Precautions/Infection Control   e. Service Objectives and Goals: Service Objectives are to assist the pt with HIV medication regimen adherence and staying in care with the Infectious Disease Clinic by identifying barriers to care.  RN will address the barriers that are identified by the patient. Marie Olson would like to have another child and stay in care on on her medications.  She has moved into a one story home.   f. Equipment required: No additional equipment needs at this time   g. Functional Limitations: None noted  h. Rehabilitation potential: Guarded   i. Diet and Nutritional Needs: Regular Diet   j. Medications and treatments: Medications have been reconciled and reviewed and are a part of EPIC electronic file   k. Specific therapies if needed: RN   l. Pertinent diagnoses: HIV disease,  Hx of medication NonCompliance   m. Expected outcome: Guarded

## 2018-03-28 NOTE — Telephone Encounter (Signed)
This is to her knowledge that I approve of this plan of care.

## 2018-04-18 NOTE — Progress Notes (Signed)
[employer] contacted for Swahili translation. Home visit made with Marie Olson today with a focus on medication adherence. Marie Olson has her medications on hand and states she has been taking the medication daily. Marie Olson states she does not have a concern related to medication intolerance and has plenty of food to eat. Marie Olson stated she is concerned about a 700 dollar gas bill but she could not locate the bill at this time. I offered assistance with trying to get her gas bill paid by [employer].

## 2018-05-10 ENCOUNTER — Ambulatory Visit: Payer: Medicaid Other | Admitting: Infectious Disease

## 2018-06-29 ENCOUNTER — Other Ambulatory Visit: Payer: Self-pay | Admitting: Infectious Disease

## 2018-06-29 DIAGNOSIS — R112 Nausea with vomiting, unspecified: Secondary | ICD-10-CM

## 2018-07-01 ENCOUNTER — Telehealth: Payer: Self-pay | Admitting: *Deleted

## 2018-07-01 NOTE — Telephone Encounter (Signed)
PATIENT ON HOLD  Plan of Care orders have expired effective 06/28/18 but GOALS have not been completely meet at this time. I would like to connect with the patient and received further orders from MD. If I am unable to get in contact with her within the next 30 days I will have to discharge at that time. RN WILL NOT RESUME CARE UNTIL NEW MD ORDERS OBTAINED AND PATIENT ABLE/WILLING TO RE-ENGAGE IN CARE

## 2018-08-04 ENCOUNTER — Telehealth: Payer: Self-pay | Admitting: *Deleted

## 2018-08-04 NOTE — Telephone Encounter (Signed)
Contacted the patient through [employer] number [phone] unable to reach the patient or leave a message. I have made several attempts to reach the patient without success so at this time I will close the case. I will be happy to offer assistance again if the patient requires or request assistance.   The intent of this communication is to inform the Health Care Team that this patient will be discharged from [facility]).  Greater than 3 attempts have been made to re-engage the patient  without any success .Moving forward, the [facility] will be willing to reopen the patient to services if and when the patient is ready to discuss medication adherence and HIV disease  management. Effective 07/26/18 patient will be discharged and removed from [facility] active patient listing.

## 2019-05-09 ENCOUNTER — Encounter: Payer: Self-pay | Admitting: Infectious Disease

## 2019-05-09 NOTE — Progress Notes (Signed)
Patient ID: Marie Olson, female   DOB: Apr 15, 1991, 29 y.o.   MRN: [id number] Working Viral Load list  Called patient with translator  Voice mail not set up

## 2019-07-24 ENCOUNTER — Ambulatory Visit: Payer: Medicaid Other | Admitting: Infectious Disease

## 2019-10-11 ENCOUNTER — Telehealth: Payer: Self-pay | Admitting: *Deleted

## 2019-10-11 NOTE — Telephone Encounter (Signed)
Marie Olson has been out of care for a while. I spoke with her husband (through interpreter services) and they agreed to be seen this coming Monday with pharmacy.  Contacted [facility]) and they do not have a refill request for the patient and she is currently inactive in their system. Tiffany stated if the patient is reactivated they will provide monthly calls with a Swahili interpreter to renew refills each month along with mailing the medications out

## 2019-10-16 ENCOUNTER — Ambulatory Visit: Payer: Medicaid Other

## 2019-10-16 ENCOUNTER — Ambulatory Visit: Payer: Medicaid Other | Admitting: Pharmacist

## 2021-06-03 ENCOUNTER — Telehealth: Payer: Self-pay

## 2021-06-03 NOTE — Telephone Encounter (Signed)
Patient last seen 03/2018. RN called patient with [employer] [phone]) - patient's husband answered and said that Jacquise is at work. Asked that he have her call out office when she returns home.   Beryle Flock, RN

## 2021-09-04 ENCOUNTER — Other Ambulatory Visit: Payer: Self-pay

## 2021-09-04 ENCOUNTER — Encounter (HOSPITAL_COMMUNITY): Payer: Self-pay | Admitting: Emergency Medicine

## 2021-09-04 ENCOUNTER — Emergency Department (HOSPITAL_COMMUNITY)
Admission: EM | Admit: 2021-09-04 | Discharge: 2021-09-04 | Disposition: A | Discharge status: Home / Self Care | Payer: BC Managed Care – PPO | Attending: Emergency Medicine | Admitting: Emergency Medicine

## 2021-09-04 DIAGNOSIS — R21 Rash and other nonspecific skin eruption: Secondary | ICD-10-CM | POA: Diagnosis present

## 2021-09-04 DIAGNOSIS — L2489 Irritant contact dermatitis due to other agents: Secondary | ICD-10-CM | POA: Diagnosis not present

## 2021-09-04 DIAGNOSIS — R000 Tachycardia, unspecified: Secondary | ICD-10-CM | POA: Insufficient documentation

## 2021-09-04 DIAGNOSIS — Z21 Asymptomatic human immunodeficiency virus [HIV] infection status: Secondary | ICD-10-CM | POA: Insufficient documentation

## 2021-09-04 MED ORDER — HYDROCORTISONE 1 % EX CREA: TOPICAL_CREAM | CUTANEOUS | 0 refills | Status: AC

## 2021-09-04 NOTE — ED Provider Notes (Signed)
?[facility]-EMERGENCY DEPT ?Provider Note ? ? ?CSN: [id number] ?Arrival date & time: 09/04/21  1633 ? ?  ? ?History ? ?Chief Complaint  ?Patient presents with  ? Rash  ? ? ?Marie Olson is a 31 y.o. female. ? ?HPI ?Patient is a 31 year old female with a history of tachycardia, chronic hepatitis B, HIV disease as well as medication noncompliance, who presents to the emergency department due to a pruritic rash.  States that it started about 4 days ago on her arms and has spread across her whole body.  Reports a burning/itching sensation.  No fevers, chills, chest pain, shortness of breath, angioedema, difficulty swallowing.  Patient states that she started a new soap prior to the onset of her rash. ? ?History obtained via Swahili interpreter. ?  ? ?Home Medications ?Prior to Admission medications   ?Medication Sig Start Date End Date Taking? Authorizing Provider  ?hydrocortisone cream 1 % Apply to affected area 2 times daily 09/04/21  Yes Placido Sou, PA-C  ?Darunavir-Cobicisctat-Emtricitabine-Tenofovir Alafenamide (SYMTUZA) 800-150-200-10 MG TABS Take 1 tablet by mouth daily with breakfast. 01/24/18   Daiva Eves, Lisette Grinder, MD  ?ondansetron (ZOFRAN-ODT) 4 MG disintegrating tablet TAKE 1 TABLET(4MG  TOTAL) BY MOUTH EVERY 8 HOURS AS NEEDED FOR NAUSEA AND VOMITING(TAKE 30 MINUTES BEFORE YOUR SYMTUZA) 07/01/18   Daiva Eves, Lisette Grinder, MD  ?   ? ?Allergies    ?Patient has no known allergies.   ? ?Review of Systems   ?Review of Systems  ?Constitutional:  Negative for chills and fever.  ?Respiratory:  Negative for shortness of breath.   ?Cardiovascular:  Negative for chest pain.  ?Gastrointestinal:  Negative for nausea and vomiting.  ?Skin:  Positive for rash. Negative for color change.  ? ?Physical Exam ?Updated Vital Signs ?BP (!) 146/95   Pulse (!) 111   Temp 98.6 ?F (37 ?C) (Oral)   Resp 19   SpO2 100%  ?Physical Exam ?Vitals and nursing note reviewed.  ?Constitutional:   ?   General: She is not  in acute distress. ?   Appearance: Normal appearance. She is well-developed and normal weight.  ?HENT:  ?   Head: Normocephalic and atraumatic.  ?   Right Ear: External ear normal.  ?   Left Ear: External ear normal.  ?   Mouth/Throat:  ?   Pharynx: Oropharynx is clear.  ?   Comments: No angioedema.  Readily handling secretions.  No hot potato voice.  No stridor. ?Eyes:  ?   General: No scleral icterus.    ?   Right eye: No discharge.     ?   Left eye: No discharge.  ?   Extraocular Movements: Extraocular movements intact.  ?   Conjunctiva/sclera: Conjunctivae normal.  ?Neck:  ?   Trachea: No tracheal deviation.  ?Cardiovascular:  ?   Rate and Rhythm: Tachycardia present.  ?Pulmonary:  ?   Effort: Pulmonary effort is normal. No respiratory distress.  ?   Breath sounds: No stridor.  ?Abdominal:  ?   General: Abdomen is flat. There is no distension.  ?Musculoskeletal:     ?   General: No swelling or deformity.  ?   Cervical back: Neck supple.  ?Skin: ?   General: Skin is warm and dry.  ?   Findings: No rash.  ?   Comments: Fine erythematous papular rash noted across the torso and all 4 extremities.  Overlying excoriations.  No drainage noted.  ?Neurological:  ?   Mental Status: She is  alert.  ?   Cranial Nerves: Cranial nerve deficit: no gross deficits.  ? ? ?ED Results / Procedures / Treatments   ?Labs ?(all labs ordered are listed, but only abnormal results are displayed) ?Labs Reviewed - No data to display ? ?EKG ?None ? ?Radiology ?No results found. ? ?Procedures ?Procedures  ? ?Medications Ordered in ED ?Medications - No data to display ? ?ED Course/ Medical Decision Making/ A&P ?  ?                        ?Medical Decision Making ? ?Patient is a 31 year old female with a history of HIV as well as medication noncompliance who presents to the emergency department due to a rash for the past 4 days. ? ?On my exam patient has a fine papular rash noted across her extremities and torso.  Started after using a new soap.   Does not appear to be any involvement of the webspaces of the fingers or toes.  No one in the house with similar symptoms.  No angioedema.  No respiratory distress noted.  Patient is tachycardic around 110 bpm on my exam.  Per record review patient has a history of tachycardia.  Sitting comfortably and is not appear to be in any acute distress.  Denies any chest pain or shortness of breath.  Does not appear consistent with anaphylaxis. ? ?Symptoms appear consistent with contact dermatitis.  Will discharge on a course of hydrocortisone cream.  Recommended Benadryl at night.  Discussed safety regarding these medications. ? ?Per record review, patient has a history of HIV and has not seen infectious disease for more than 4 years and is not taking any regular medications.  We had a long discussion regarding this.  She was given their contact information and I urged the patient strongly to reach back out to infectious disease to schedule an appointment for reevaluation.  She verbalized understanding. ? ?Patient appears stable for discharge at this time and she is agreeable.  We discussed return precautions in length.  Her questions were answered and she was amicable at the time of discharge. ? ?Final Clinical Impression(s) / ED Diagnoses ?Final diagnoses:  ?Irritant contact dermatitis due to other agents  ? ?Rx / DC Orders ?ED Discharge Orders   ? ?      Ordered  ?  hydrocortisone cream 1 %       ? 09/04/21 1844  ? ?  ?  ? ?  ? ? ?  ?Placido Sou, PA-C ?09/04/21 1855 ? ?  ?Charlynne Pander, MD ?09/04/21 2308 ? ?

## 2021-09-04 NOTE — Discharge Instructions (Addendum)
Like we discussed, I am prescribing you a steroid cream called hydrocortisone.  You can apply this to the rash on your skin 1-2 times per day.  This should help with the rash as well as the itching.  You can also purchase Benadryl over-the-counter and begin taking this at night.  Please follow the instructions on the box.  This will help with itchiness as well as difficulty sleeping.  This medication is sedating so please do not operate a motor vehicle after taking this.  Do not mix with alcohol. ? ?Please discontinue the new soap that you recently began prior to the onset of your rash. ? ?Below is the contact information for infectious disease.  Please give them a call and schedule an appointment for reevaluation. ? ?Please return to the emergency department with any new or worsening symptoms. ? ?######################### ? ?Kama tulivyojadili, ninakuagiza cream ya steroid inayoitwa haidrokotisoni. Unaweza kutumia hii kwa upele kwenye ngozi yako mara 1-2 kwa siku. Hii inapaswa kusaidia na upele pamoja na kuwasha. Unaweza pia kununua Benadryl kwenye kaunta na uanze kuichukua usiku. Hii itasaidia kwa kuwasha na ugumu wa kulala. Dawa hii inatuliza kwa hivyo tafadhali usiendeshe gari baada Lahoma Crocker. Usichanganye na pombe. ? ?Tafadhali acha kutumia sabuni mpya ambayo ulianza hivi majuzi kabla ya Pricilla Handler. ? ?Ifuatayo ni maelezo ya mawasiliano ya magonjwa ya [place]. Tafadhali wapigie simu na upange miadi ya kutathminiwa upya. ? ?Tafadhali rudi kwa idara ya dharura ukiwa na dalili zozote mpya au mbaya zaidi. ?

## 2021-09-04 NOTE — ED Triage Notes (Signed)
Pt reports itchy rash on bilateral arms x 4 days.  ?

## 2021-09-17 ENCOUNTER — Ambulatory Visit: Payer: BC Managed Care – PPO | Admitting: Infectious Disease

## 2021-09-18 ENCOUNTER — Encounter: Payer: Self-pay | Admitting: Family

## 2021-09-18 ENCOUNTER — Other Ambulatory Visit: Payer: Self-pay

## 2021-09-18 ENCOUNTER — Ambulatory Visit (INDEPENDENT_AMBULATORY_CARE_PROVIDER_SITE_OTHER): Payer: BC Managed Care – PPO | Admitting: Family

## 2021-09-18 ENCOUNTER — Ambulatory Visit: Payer: BC Managed Care – PPO

## 2021-09-18 VITALS — BP 130/84 | HR 106 | Temp 97.4°F | Wt 92.0 lb

## 2021-09-18 DIAGNOSIS — Z113 Encounter for screening for infections with a predominantly sexual mode of transmission: Secondary | ICD-10-CM | POA: Diagnosis not present

## 2021-09-18 DIAGNOSIS — B20 Human immunodeficiency virus [HIV] disease: Secondary | ICD-10-CM | POA: Diagnosis not present

## 2021-09-18 DIAGNOSIS — Z0000 Encounter for general adult medical examination without abnormal findings: Secondary | ICD-10-CM

## 2021-09-18 DIAGNOSIS — R21 Rash and other nonspecific skin eruption: Secondary | ICD-10-CM

## 2021-09-18 DIAGNOSIS — Z79899 Other long term (current) drug therapy: Secondary | ICD-10-CM

## 2021-09-18 DIAGNOSIS — Z91199 Patient's noncompliance with other medical treatment and regimen due to unspecified reason: Secondary | ICD-10-CM

## 2021-09-18 DIAGNOSIS — B181 Chronic viral hepatitis B without delta-agent: Secondary | ICD-10-CM

## 2021-09-18 LAB — POCT URINE PREGNANCY: Preg Test, Ur: NEGATIVE

## 2021-09-18 MED ORDER — TRIAMCINOLONE ACETONIDE 0.1 % EX CREA: 1.0000 "application " | TOPICAL_CREAM | Freq: Two times a day (BID) | CUTANEOUS | 2 refills | Status: AC

## 2021-09-18 NOTE — Progress Notes (Signed)
Brief Narrative   Patient ID: Marie Olson, female    DOB: June 05, 1990, 31 y.o.   MRN: [id number]  Marie Olson is a 31 y/o African female diagnosed with HIV disease in February 2016 with risk factor of heterosexual contact. Refugee and suspect she was positive sooner as she and her husband are positive and 40 year old daughter at the time was also positive. Initial viral load was 74,516 and CD4 count of 220. Genotype with no significant medication resistance mutations. Co-infection with Hepatitis B. No history of opportunistic infection. Previous ART history with Isentress, Truvada, Descovy, Biktarvy, and currently with Symtuza. Entered care at [facility] Stage 2.   Subjective:    Chief Complaint  Patient presents with   New Patient (Initial Visit)    B20 -  patient reports she is still taking Symtuza. Patient having itching on hands and arms.     HPI:  Marie Olson is a 31 y.o. female with hIV disease last seen by Janene Madeira, NP on 03/07/18 with less than optimal adherence to her ART regimen of Symtuza. Viral load was 38,300 and CD4 count 440. Pre-conception counseling was discussed with the goal of getting her undetectable. She returns today as a walk-in appointment having missed an appointment yesterday. She is accompanied by her husband and preferred primary language is Swahili and a medical interpreter is present via tablet to aid in communication.   Marie Olson has been off medication for the at least the past year. No problems with Symtuza when she was taking it. Recently seen in the ED with rash located on her bilateral upper extremities and diagnosed with contact dermatitis for which she was given hydrocortisone cream. She has used this cream with some improvements and continues to have symptoms. Because she works in [employer] her employer is asking for completion of FMLA paperwork. Denies fevers, chills, night sweats, headaches, changes in vision, neck pain/stiffness, nausea,  diarrhea, vomiting, or lesions.  Marie Olson is covered through [facility] and will need a copay card to assist with payment. Denies feelings of being down, depressed or hopeless. No current recreational or illicit drug use, tobacco use, or alcohol consumption. Housing is currently stable with adequate access to food. Condoms offered.   No Known Allergies    Outpatient Medications Prior to Visit  Medication Sig Dispense Refill   Darunavir-Cobicisctat-Emtricitabine-Tenofovir Alafenamide (SYMTUZA) 800-150-200-10 MG TABS Take 1 tablet by mouth daily with breakfast. 30 tablet 5   hydrocortisone cream 1 % Apply to affected area 2 times daily (Patient not taking: Reported on 09/18/2021) 15 g 0   ondansetron (ZOFRAN-ODT) 4 MG disintegrating tablet TAKE 1 TABLET(4MG  TOTAL) BY MOUTH EVERY 8 HOURS AS NEEDED FOR NAUSEA AND VOMITING(TAKE 30 MINUTES BEFORE YOUR SYMTUZA) (Patient not taking: Reported on 09/18/2021) 30 tablet 2   No facility-administered medications prior to visit.     Past Medical History:  Diagnosis Date   Abscess    Chronic hepatitis B without delta agent without hepatic coma ([place]) 09/10/2014   Dysphagia 04/08/2016   HIV (human immunodeficiency virus infection) ([place])    Housing problems 12/24/2014   Hypertension    Malnutrition ([place]) 10/13/2017   Symptoms of upper respiratory infection ([facility]) 04/08/2016     Past Surgical History:  Procedure Laterality Date   DIAGNOSTIC LAPAROSCOPY WITH REMOVAL OF ECTOPIC PREGNANCY N/A 06/03/2015   Procedure: DIAGNOSTIC LAPAROSCOPY,  Left Salpingectomy with  REMOVAL OF ECTOPIC PREGNANCY;  Surgeon: Woodroe Mode, MD;  Location: [facility];  Service: Gynecology;  Laterality: N/A;      Review of Systems  Constitutional:  Negative for appetite change, chills, diaphoresis, fatigue, fever and unexpected weight change.  Eyes:        Negative for acute change in vision  Respiratory:  Negative for chest tightness, shortness of breath and wheezing.    Cardiovascular:  Negative for chest pain.  Gastrointestinal:  Negative for diarrhea, nausea and vomiting.  Genitourinary:  Negative for dysuria, pelvic pain and vaginal discharge.  Musculoskeletal:  Negative for neck pain and neck stiffness.  Skin:  Negative for rash.  Neurological:  Negative for seizures, syncope, weakness and headaches.  Hematological:  Negative for adenopathy. Does not bruise/bleed easily.  Psychiatric/Behavioral:  Negative for hallucinations.      Objective:    BP 130/84   Pulse (!) 106   Temp (!) 97.4 F (36.3 C) (Temporal)   Wt 92 lb (41.7 kg)   SpO2 98%   BMI 17.97 kg/m  Nursing note and vital signs reviewed.  Physical Exam Constitutional:      General: She is not in acute distress.    Appearance: She is well-developed.  Eyes:     Conjunctiva/sclera: Conjunctivae normal.  Cardiovascular:     Rate and Rhythm: Normal rate and regular rhythm.     Heart sounds: Normal heart sounds. No murmur heard.   No friction rub. No gallop.  Pulmonary:     Effort: Pulmonary effort is normal. No respiratory distress.     Breath sounds: Normal breath sounds. No wheezing or rales.  Chest:     Chest wall: No tenderness.  Abdominal:     General: Bowel sounds are normal.     Palpations: Abdomen is soft.     Tenderness: There is no abdominal tenderness.  Musculoskeletal:     Cervical back: Neck supple.  Lymphadenopathy:     Cervical: No cervical adenopathy.  Skin:    General: Skin is warm and dry.     Findings: Rash (scaly diffuse rash located on bilateral upper extremities) present.  Neurological:     Mental Status: She is alert and oriented to person, place, and time.  Psychiatric:        Behavior: Behavior normal.        Thought Content: Thought content normal.        Judgment: Judgment normal.        09/18/2021    2:50 PM 03/07/2018   10:51 AM 10/29/2017    9:33 AM 09/01/2017   11:33 AM 01/20/2017   11:09 AM  Depression screen PHQ 2/9  Decreased  Interest 0 0 0 0 0  Down, Depressed, Hopeless 0 0 0 0 0  PHQ - 2 Score 0 0 0 0 0       Assessment & Plan:    Patient Active Problem List   Diagnosis Date Noted   Rash and nonspecific skin eruption 09/19/2021   Healthcare maintenance 03/10/2018   Malnutrition ([place]) 10/13/2017   Dysphagia 04/08/2016   Noncompliance 10/03/2015   Housing problems 12/24/2014   Chronic hepatitis B without delta agent without hepatic coma ([place]) 09/10/2014   Tachycardia    HIV disease ([place])    Encounter for preconception consultation 07/07/2014   Immigrant with language difficulty 07/07/2014   Palpitations 07/07/2014     Problem List Items Addressed This Visit       Digestive   Chronic hepatitis B without delta agent without hepatic coma ([place]) - Primary    Appears based on previous lab work  that Marie Olson has inactive chronic Hepatitis B with non-reactive Hepatitis B e antigen. Will check updated lab work today and obtain fibrosis score. Will arrange for ultrasound to check for [facility]. Hepatitis B covered by [employer] in [place]. No signs/symptoms concerning for flare or exacerbation.        Relevant Orders   Hepatitis B core antibody, IgM   Hepatitis B core antibody, total   Hepatitis B DNA, ultraquantitative, PCR   Hepatitis B e antibody   Hepatitis B e antigen   Hepatitis B surface antibody,quantitative   Hepatitis B surface antigen   Liver Fibrosis, FibroTest-ActiTest     Musculoskeletal and Integument   Rash and nonspecific skin eruption    Previously diagnosed with contact dermatitis with symptoms appearing almost eczematous. Will increase steroid cream to triamcinolone and recommend moisturizing areas. Unable to complete FMLA paperwork as forms provided are incomplete.Marland Kitchen She will have follow up in 1 week. May need to consider oral course of corticosteroids or referral to dermatology if symptoms do not improve.          Other   HIV disease ([place])    Marie Olson has poorly controlled  virus secondary to less than optimal adherence to her medication and lack of follow up. Her last prescription was in 2019 and suspect she has been off medication for at least the past 3 years. Discussed importance of taking medication daily as prescribed. Check lab work today. Copay card activated with patient. Plan for follow up in 1 month or sooner if needed with lab work on the same day.   ADDENDUM: Unable to get lab work today. Has lab appointment for Monday, 09/22/21.        Relevant Orders   POCT urine pregnancy (Completed)   Comprehensive metabolic panel   HIV RNA, RTPCR W/R GT (RTI, PI,INT)   Noncompliance    Marie Olson has been out of care for the past 3 years. Discussed importance of taking medications daily to prevent complication and progression of disease and routine follow up.        Healthcare maintenance    Discussed importance of safe sexual practice and condom use. Condoms offered.  Will discuss routine screenings at next office visit.       Other Visit Diagnoses     Screening for STDs (sexually transmitted diseases)       Relevant Orders   RPR   Pharmacologic therapy       Relevant Orders   Lipid panel        I am having Tirza Bohorquez start on triamcinolone cream. I am also having her maintain her Darunavir-Cobicistat-Emtricitabine-Tenofovir Alafenamide, ondansetron, and hydrocortisone cream.   Meds ordered this encounter  Medications   triamcinolone cream (KENALOG) 0.1 %    Sig: Apply 1 application. topically 2 (two) times daily.    Dispense:  30 g    Refill:  2    Order Specific Question:   Supervising Provider    Answer:   Carlyle Basques [[id number]]   A total of 70 minutes was spent on this visit reviewing previous notes, counseling the patient on importance of follow up and taking medication, activation of copay card for medication assistance, evaluation of rash and lesions with discussion of needed FMLA paperwork,  ordering tests (HIV, Hepatitis  B, and CMET), adjusting meds, and documenting the findings in the note   Follow-up: Return in about 1 month (around 10/19/2021), or if symptoms worsen or fail to improve.   Terri Piedra,  MSN, FNP-C Nurse Practitioner [facility] for [place] number: [phone]

## 2021-09-18 NOTE — Progress Notes (Signed)
[employer] Card Information    ID: [id number] BIN: [id number] Group #: [id number] PCN: [id number]  if pharmacy has any questions please call [phone]  Linwood Dibbles was contacted on three way via pacific interpreter line - patient gave consent. Patient had to create a new account to access card information - patient given information to give to her pharmacy.     Kamilo Och Lesli Albee, CMA

## 2021-09-18 NOTE — Patient Instructions (Signed)
Nice to see you.  We will check your lab work today.   Restart taking medication daily.   Plan for follow up in 1 month or sooner if needed.

## 2021-09-19 ENCOUNTER — Other Ambulatory Visit: Payer: Self-pay | Admitting: Pharmacist

## 2021-09-19 ENCOUNTER — Telehealth: Payer: Self-pay

## 2021-09-19 DIAGNOSIS — R21 Rash and other nonspecific skin eruption: Secondary | ICD-10-CM | POA: Insufficient documentation

## 2021-09-19 DIAGNOSIS — B20 Human immunodeficiency virus [HIV] disease: Secondary | ICD-10-CM

## 2021-09-19 MED ORDER — SYMTUZA 800-150-200-10 MG PO TABS: 1.0000 | ORAL_TABLET | Freq: Every day | ORAL | 0 refills | Status: AC

## 2021-09-19 NOTE — Assessment & Plan Note (Addendum)
Previously diagnosed with contact dermatitis with symptoms appearing almost eczematous. Will increase steroid cream to triamcinolone and recommend moisturizing areas. Unable to complete FMLA paperwork as forms provided are incomplete.Marland Kitchen She will have follow up in 1 week. May need to consider oral course of corticosteroids or referral to dermatology if symptoms do not improve.

## 2021-09-19 NOTE — Assessment & Plan Note (Signed)
Marie Olson has been out of care for the past 3 years. Discussed importance of taking medications daily to prevent complication and progression of disease and routine follow up.

## 2021-09-19 NOTE — Assessment & Plan Note (Signed)
Appears based on previous lab work that Marie Olson has inactive chronic Hepatitis B with non-reactive Hepatitis B e antigen. Will check updated lab work today and obtain fibrosis score. Will arrange for ultrasound to check for [facility]. Hepatitis B covered by [employer] in [place]. No signs/symptoms concerning for flare or exacerbation.

## 2021-09-19 NOTE — Telephone Encounter (Signed)
Called patient using pacific interpreter line (swahili interpreter) - no answer or voicemail box set up to leave a message on either number listed in chart.    Addasyn Mcbreen Lesli Albee, CMA

## 2021-09-19 NOTE — Assessment & Plan Note (Signed)
Marie Olson has poorly controlled virus secondary to less than optimal adherence to her medication and lack of follow up. Her last prescription was in 2019 and suspect she has been off medication for at least the past 3 years. Discussed importance of taking medication daily as prescribed. Check lab work today. Copay card activated with patient. Plan for follow up in 1 month or sooner if needed with lab work on the same day.   ADDENDUM: Unable to get lab work today. Has lab appointment for Monday, 09/22/21.

## 2021-09-19 NOTE — Progress Notes (Signed)
Medication Samples have been provided to the patient.  Drug name: Symtuza        Strength: 800/150/200/10 mg Qty: 30  Tablets (1 bottles) LOT: [id number]   Exp.Date: 4/25  Dosing instructions: Take one tablet by mouth once daily with food  The patient has been instructed regarding the correct time, dose, and frequency of taking this medication, including desired effects and most common side effects.   Rozann Holts, PharmD, CPP Clinical Pharmacist Practitioner Infectious Diseases Clinical Pharmacist [facility]  

## 2021-09-19 NOTE — Assessment & Plan Note (Signed)
   Discussed importance of safe sexual practice and condom use. Condoms offered.   Will discuss routine screenings at next office visit.

## 2021-09-22 ENCOUNTER — Other Ambulatory Visit: Payer: Self-pay

## 2021-09-22 ENCOUNTER — Other Ambulatory Visit: Payer: BC Managed Care – PPO

## 2021-09-22 DIAGNOSIS — Z79899 Other long term (current) drug therapy: Secondary | ICD-10-CM

## 2021-09-22 DIAGNOSIS — B20 Human immunodeficiency virus [HIV] disease: Secondary | ICD-10-CM

## 2021-09-22 DIAGNOSIS — B181 Chronic viral hepatitis B without delta-agent: Secondary | ICD-10-CM

## 2021-09-22 DIAGNOSIS — Z113 Encounter for screening for infections with a predominantly sexual mode of transmission: Secondary | ICD-10-CM

## 2021-09-23 LAB — T-HELPER CELL (CD4) - (RCID CLINIC ONLY)
CD4 % Helper T Cell: 2 % — ABNORMAL LOW (ref 33–65)
CD4 T Cell Abs: <35 /uL — ABNORMAL LOW (ref 400–1790)

## 2021-10-04 LAB — HIV RNA, RTPCR W/R GT (RTI, PI,INT)
HIV 1 RNA Quant: 1240000 copies/mL — ABNORMAL HIGH
HIV-1 RNA Quant, Log: 6.09 Log copies/mL — ABNORMAL HIGH

## 2021-10-04 LAB — LIVER FIBROSIS, FIBROTEST-ACTITEST
ALT: 12 U/L (ref 6–29)
Alpha-2-Macroglobulin: 255 mg/dL (ref 106–279)
Apolipoprotein A1: 135 mg/dL (ref 101–198)
Bilirubin: 0.3 mg/dL (ref 0.2–1.2)
Fibrosis Score: 0.13
GGT: 19 U/L (ref 3–50)
Haptoglobin: 111 mg/dL (ref 43–212)
Necroinflammat ACT Score: 0.03
Reference ID: 4386474

## 2021-10-04 LAB — COMPREHENSIVE METABOLIC PANEL
AG Ratio: 0.8 (calc) — ABNORMAL LOW (ref 1.0–2.5)
ALT: 14 U/L (ref 6–29)
AST: 38 U/L — ABNORMAL HIGH (ref 10–30)
Albumin: 3.8 g/dL (ref 3.6–5.1)
Alkaline phosphatase (APISO): 67 U/L (ref 31–125)
BUN: 11 mg/dL (ref 7–25)
CO2: 27 mmol/L (ref 20–32)
Calcium: 9.1 mg/dL (ref 8.6–10.2)
Chloride: 103 mmol/L (ref 98–110)
Creat: 0.71 mg/dL (ref 0.50–0.97)
Globulin: 4.8 g/dL (calc) — ABNORMAL HIGH (ref 1.9–3.7)
Glucose, Bld: 114 mg/dL — ABNORMAL HIGH (ref 65–99)
Potassium: 3.6 mmol/L (ref 3.5–5.3)
Sodium: 139 mmol/L (ref 135–146)
Total Bilirubin: 0.3 mg/dL (ref 0.2–1.2)
Total Protein: 8.6 g/dL — ABNORMAL HIGH (ref 6.1–8.1)

## 2021-10-04 LAB — HEPATITIS B SURFACE ANTIGEN: Hepatitis B Surface Ag: REACTIVE — AB

## 2021-10-04 LAB — HEPATITIS B DNA, ULTRAQUANTITATIVE, PCR
Hepatitis B DNA (Calc): 1.38 Log IU/mL — ABNORMAL HIGH
Hepatitis B DNA: 24 IU/mL — ABNORMAL HIGH

## 2021-10-04 LAB — HIV-1 INTEGRASE GENOTYPE

## 2021-10-04 LAB — HEPATITIS B E ANTIBODY: Hep B E Ab: REACTIVE — AB

## 2021-10-04 LAB — LIPID PANEL
Cholesterol: 213 mg/dL — ABNORMAL HIGH (ref <200)
HDL: 39 mg/dL — ABNORMAL LOW (ref >=50)
LDL Cholesterol (Calc): 146 mg/dL (calc) — ABNORMAL HIGH
Non-HDL Cholesterol (Calc): 174 mg/dL (calc) — ABNORMAL HIGH (ref <130)
Total CHOL/HDL Ratio: 5.5 (calc) — ABNORMAL HIGH (ref <5.0)
Triglycerides: 146 mg/dL (ref <150)

## 2021-10-04 LAB — HEPATITIS B SURFACE ANTIBODY, QUANTITATIVE: Hep B S AB Quant (Post): <5 m[IU]/mL — ABNORMAL LOW (ref >=10)

## 2021-10-04 LAB — HEPATITIS B CORE ANTIBODY, IGM: Hep B C IgM: NONREACTIVE

## 2021-10-04 LAB — HEPATITIS B E ANTIGEN: Hep B E Ag: NONREACTIVE

## 2021-10-04 LAB — HEPATITIS B CORE ANTIBODY, TOTAL: Hep B Core Total Ab: REACTIVE — AB

## 2021-10-04 LAB — HIV-1 GENOTYPE: HIV-1 Genotype: DETECTED — AB

## 2021-10-04 LAB — RPR: RPR Ser Ql: NONREACTIVE

## 2021-10-20 ENCOUNTER — Ambulatory Visit: Payer: Medicaid Other | Admitting: Family

## 2021-11-19 ENCOUNTER — Telehealth: Payer: Self-pay

## 2021-11-19 NOTE — Telephone Encounter (Signed)
Working viral load list, patient is due for appointment and needs new Symtuza Rx. Called patient with [employer] on both numbers listed in chart without success and unable to leave message.  Sandie Ano, RN

## 2021-12-02 DEATH — deceased
